# Patient Record
Sex: Female | Born: 1965 | Race: White | Hispanic: No | Marital: Married | Smoking: Never smoker
Health system: Southern US, Community
[De-identification: ages and names within clinical notes are randomized; demographics above are authoritative.]

## PROBLEM LIST (undated history)

## (undated) DIAGNOSIS — F32A Depression, unspecified: Secondary | ICD-10-CM

## (undated) DIAGNOSIS — E559 Vitamin D deficiency, unspecified: Secondary | ICD-10-CM

## (undated) DIAGNOSIS — F329 Major depressive disorder, single episode, unspecified: Secondary | ICD-10-CM

## (undated) DIAGNOSIS — C50919 Malignant neoplasm of unspecified site of unspecified female breast: Secondary | ICD-10-CM

## (undated) DIAGNOSIS — G43909 Migraine, unspecified, not intractable, without status migrainosus: Secondary | ICD-10-CM

## (undated) HISTORY — DX: Vitamin D deficiency, unspecified: E55.9

## (undated) HISTORY — DX: Malignant neoplasm of unspecified site of unspecified female breast: C50.919

## (undated) HISTORY — DX: Migraine, unspecified, not intractable, without status migrainosus: G43.909

## (undated) HISTORY — PX: LASIK: SHX215

## (undated) HISTORY — PX: BREAST SURGERY: SHX581

## (undated) HISTORY — PX: TUBAL LIGATION: SHX77

## (undated) HISTORY — DX: Depression, unspecified: F32.A

## (undated) HISTORY — DX: Major depressive disorder, single episode, unspecified: F32.9

---

## 2004-01-12 ENCOUNTER — Ambulatory Visit (HOSPITAL_COMMUNITY): Admission: RE | Admit: 2004-01-12 | Discharge: 2004-01-12 | Payer: Self-pay | Admitting: Gynecology

## 2006-06-02 ENCOUNTER — Ambulatory Visit: Payer: Self-pay | Admitting: Gynecology

## 2007-05-28 ENCOUNTER — Ambulatory Visit: Payer: Self-pay | Admitting: Obstetrics & Gynecology

## 2008-02-05 HISTORY — PX: OTHER SURGICAL HISTORY: SHX169

## 2010-02-15 ENCOUNTER — Ambulatory Visit: Payer: Self-pay | Admitting: Family Medicine

## 2010-02-21 ENCOUNTER — Ambulatory Visit: Payer: Self-pay | Admitting: Family Medicine

## 2010-03-06 ENCOUNTER — Ambulatory Visit: Payer: Self-pay | Admitting: Surgery

## 2010-03-13 ENCOUNTER — Ambulatory Visit: Payer: Self-pay | Admitting: Surgery

## 2010-03-15 ENCOUNTER — Ambulatory Visit: Payer: Self-pay | Admitting: Surgery

## 2010-03-15 HISTORY — PX: BREAST LUMPECTOMY: SHX2

## 2010-03-23 LAB — PATHOLOGY REPORT

## 2010-04-14 ENCOUNTER — Ambulatory Visit: Payer: Self-pay | Admitting: Family Medicine

## 2010-05-11 HISTORY — PX: PORT-A-CATH REMOVAL: SHX5289

## 2010-06-19 NOTE — Assessment & Plan Note (Signed)
NAME:  Brianna Gomez, Brianna Gomez NO.:  [id number]   MEDICAL RECORD NO.:  [id number]          PATIENT TYPE:  POB   LOCATION:  [facility] at [facility]         FACILITY:  [facility]   PHYSICIAN:  Ginger Carne, MD DATE OF BIRTH:  September 06, 1965   DATE OF SERVICE:  05/28/2007                                  CLINIC NOTE   ADDENDUM:  [name] complains of migraine headaches on the first day of  her menses which occur twice a month now.  These are not cluster in  nature and they have no oral component, but they are getting worse and  lasting several hours in the day.  I prescribed Fioricet 1-2 every 6-8  hours as needed #40 with one refill.  I told the patient to contact the  office if this is not beneficial for her.           ______________________________  Ginger Carne, MD     SHB/MEDQ  D:  05/28/2007  T:  05/28/2007  Job:  [id number]

## 2010-06-19 NOTE — Assessment & Plan Note (Signed)
NAME:  JALEXUS, BRETT                ACCOUNT NO.:  [id number]   MEDICAL RECORD NO.:  [id number]          PATIENT TYPE:  POB   LOCATION:  [facility] at [facility]         FACILITY:  [facility]   PHYSICIAN:  Ginger Carne, MD DATE OF BIRTH:  03/06/1965   DATE OF SERVICE:  05/28/2007                                  CLINIC NOTE   HISTORY OF PRESENT ILLNESS:  This is a 45 year old Caucasian,  multiparous female status post bilateral tubal ligation who presents  with a one year history of menses occurring every 2 weeks lasting 5-6  days with significant clotting.  The patient had a normal TSH level in  April 2008 and hemoglobin was 12.4 in April 2008.  She states she has  cramping associated with said bleeding.  She takes no medication to  enhance her bleeding propensity and has no personal family history of  bleeding diatheses.   PELVIC EXAMINATION:  Uterus is retroverted and flexed, normal size.  Both adnexa palpable, nontender.  Transvaginal ultrasound reveals the  uterus to be 5.7 cm an anterior posterior and sagittal dimension.  No  focal lesions were noted in the myometrium.  Endometrial biopsy was  performed.   We discussed various options including placement of a Mirena  intrauterine device, NovaSure endometrial ablation, total vaginal  hysterectomy with preservation of both tubes and ovaries and  contraceptive management as well.  The patient is leaning towards  NovaSure procedure.  She was provided information and literature and we  will contact her when said results are available.  She is scheduled to  have a yearly physical examination and this will be done in the near  future including a Pap smear.           ______________________________  Ginger Carne, MD     SHB/MEDQ  D:  05/28/2007  T:  05/28/2007  Job:  [id number]

## 2010-06-22 NOTE — H&P (Signed)
Brianna Gomez, Brianna Gomez                ACCOUNT NO.:  [id number]   MEDICAL RECORD NO.:  [id number]          PATIENT TYPE:  POB   LOCATION:  [facility]                          FACILITY:  [facility]   PHYSICIAN:  Ginger Carne, MD  DATE OF BIRTH:  Apr 25, 1965   DATE OF ADMISSION:  05/28/2007  DATE OF DISCHARGE:  05/28/2007                              HISTORY & PHYSICAL   REASON FOR HOSPITALIZATION:  Menometrorrhagia.   HISTORY OF PRESENT ILLNESS:  This patient is a 45 year old gravida 4,  para 3-0-1-3 Caucasian female who presents with a 1-year history of  worsening heavy menses lasting 10-14 days and occurring approximately  every 2 weeks.  It should be lasting 5-6 days and occurring every 2  weeks.  The patient had a normal TSH level in April 2008.  An  endometrial biopsy performed on May 28, 2007, revealed benign  secretory endometrium without neoplasia or hyperplasia.  Transvaginal  ultrasound revealed no evidence of myometrial lesions or obvious  intracavitary lesions on both adnexa were negative.  The patient takes  no medications to enhance her bleeding propensity and has no personal or  family history of bleeding diatheses.  The patient had been offered oral  contraceptives but had emotionally issues related to same.  She also  denied the option of a Marina intrauterine device.  Total vaginal  hysterectomy was also offered and the patient had settled down t  NovaSure endometrial ablation technique.   OB/GYN HISTORY:  The patient has had one first trimester miscarriage and  three full-term vaginal deliveries.  She has had a bilateral tubal  ligation performed in December 2005.   ALLERGIES:  None.   CURRENT MEDICATIONS:  None   MEDICAL HISTORY:  None.   SURGICAL HISTORY:  None.   SOCIAL HISTORY:  The patient is a nonsmoker.  Denies alcohol or illicit  drug abuse.   Family history is negative for first-degree relatives with breast,  ovarian, or uterine carcinoma.   REVIEW OF  SYSTEMS:  A 14-point comprehensive review of systems within  normal limits.   PHYSICAL EXAMINATION:  Blood pressure is 112/73, weight 127 pounds,  height 5 feet 1 inch, and pulse 73 and regular.  HEENT: Grossly normal.  Breast exam without masses, discharge, thickenings, or tenderness.  CHEST:  Clear to percussion and auscultation.  CARDIOVASCULAR:  Without murmurs or enlargements.  Regular rate and  rhythm.  EXTREMITIES, LYMPHATICS, SKIN, NEUROLOGICAL, MUSCULOSKELETAL SYSTEMS:  Within normal limits.  ABDOMEN:  Soft without gross hepatosplenomegaly.  PELVIC:  External Genitalia: Vulva and vagina normal.  Cervix smooth  without erosions or lesions.  Normal Pap smear on June 01, 2006,  preceded by series of normal Pap smears.  Uterus is normal sized.  Both  adnexa palpable and found to be normal.   IMPRESSION:  Menometrorrhagia.   PLAN:  After discussing aforementioned options including a Jearld Adjutant IUD,  oral contraceptive agents, and total vaginal hysterectomy, the patient  has agreed to a NovaSure endometrial ablation.  Ashby Dawes of said procedure  was discussed in detail.  Failure rate of approximately 50% for complete  amenorrhea  was understood by the patient.  She also understands that  approximately 20-25% of women who ultimately have this procedure may  eventually have a hysterectomy.  All questions were answered to the  satisfaction of the patient.  The patient verbalized understanding of  same.      Ginger Carne, MD  Electronically Signed     SHB/MEDQ  D:  07/20/2007  T:  07/21/2007  Job:  [id number]

## 2010-06-22 NOTE — Op Note (Signed)
NAMESLYVIA, LARTIGUE                ACCOUNT NO.:  [id number]   MEDICAL RECORD NO.:  [id number]          PATIENT TYPE:  AMB   LOCATION:  [facility]                           [facility]ACILITY[facility]  [facility]   PHYSICIAN:  Ginger Carne, MD  DATE OF BIRTH:  02-26-1965   DATE OF PROCEDURE:  DATE OF DISCHARGE:                                 OPERATIVE REPORT   PREOPERATIVE DIAGNOSES:  1.  Sterilization.  2.  Intrauterine device removal.   POSTOPERATIVE DIAGNOSES:  1.  Sterilization.  2.  Intrauterine device removal.   PROCEDURE:  1.  Bilateral laparoscopic tubal banding.  2.  Intrauterine device removal.   SURGEON:  Ginger Carne, M.D.   ASSISTANT:  None.   COMPLICATIONS:  None immediately.   ANESTHESIA:  General.   ESTIMATED BLOOD LOSS:  Negligible.   SPECIMENS:  None.   OPERATIVE FINDINGS:  External genitalia, vulva, and vagina normal.  Cervix  smooth without erosions or lesions.  The intrauterine device was easily  removed, identical to a Mirena intrauterine device.  Laparoscopic evaluation  demonstrated normal adnexa.  No evidence for pathology in the pelvis or  upper abdomen.  Uterine contour normal.  Uterine size normal.   OPERATIVE PROCEDURE:  The patient was prepped and draped in the usual  fashion and placed in the lithotomy position.  Betadine solution used for  antiseptic and the patient was catheterized prior to the procedure.  After  adequate general anesthesia, tenaculum placed on the anterior lip of the  cervix, intrauterine device removed easily, and a Hickman tenaculum placed  in endocervical canal.   A vertical infraumbilical incision was made and the Veress needle placed in  the abdomen.  Opening and closing pressures were 10 and 15 mmHg.  Gas-  release trocar placed in the same incision and laparoscope placed in the  trocar sleeve.  Afterwards, an 8 mm port was made under direct visualization  of the left lower quadrant.  Inspection of the pelvic contents and  abdominal  contents carried out.  The Falope ring applicator utilized to place said  rings on the isthmus and ampullary junction of both tubes.  These were well  applied without active bleeding.  Gas-release  trocars removed.  Closure of the 10 mm fascia with 0 Vicryl suture and  Dermabond for the skin.  Instrument and sponge count were correct.  The  patient tolerated the procedure well and returned to the postanesthesia  recovery room in excellent condition.     Stev   SHB/MEDQ  D:  01/12/2004  T:  01/12/2004  Job:  [id number]

## 2010-06-22 NOTE — H&P (Signed)
NAMEMIASHA, Brianna Gomez                ACCOUNT NO.:  [id number]   MEDICAL RECORD NO.:  [id number]          PATIENT TYPE:  AMB   LOCATION:  [facility]                           [facility]ACILITY[facility]   PHYSICIAN:  Ginger Carne, MD  DATE OF BIRTH:  1965/09/28   DATE OF ADMISSION:  01/12/2004  DATE OF DISCHARGE:                                HISTORY & PHYSICAL   ADMISSION DIAGNOSIS:  Request for permanent sterilization.   PROPOSED PROCEDURE:  Laparoscopic bilateral tubal banding and removal of  intrauterine device.   HISTORY OF PRESENT ILLNESS:  This patient is a 45 year old gravida 4, para 3-  0-1-3, Caucasian female admitted for a bilateral laparoscopic tubal banding.  The patient has no desire for further childbearing and understands that said  procedure is intended to be permanent.  At present she has a Mirena  intrauterine device that has been inserted in May 2005.  This was meant to  be temporary with the view that her husband would undergo a vasectomy, which  he has not followed through with.   OBSTETRIC/GYNECOLOGIC HISTORY:  The patient has had three full-term vaginal  deliveries in 1990, 1998, and also in 2002.  She had undergone a first  trimester miscarriage followed by a dilation and curettage in 1998.  The  patient's method of contraception, a Mirena intrauterine device.   ALLERGIES:  None.   CURRENT MEDICATIONS:  None.   SOCIAL HISTORY:  Negative for smoking, illicit drug abuse, or alcohol abuse.   FAMILY HISTORY:  Negative for breast, colon, ovarian, or uterine carcinoma.   MEDICAL AND SURGICAL HISTORY:  Negative.   REVIEW OF SYSTEMS:  Negative.   PHYSICAL EXAMINATION:  VITAL SIGNS:  Blood pressure 100/70, height 5 feet 4  inches, weight 143 pounds.  HEENT:  Grossly normal.  BREASTS:  Without masses, discharge, thickenings, or tenderness.  CHEST:  Clear to percussion and auscultation.  CARDIOVASCULAR:  Without murmurs or enlargements.  VASCULAR, EXTREMITY, LYMPHATIC,  SKIN, NEUROLOGIC, MUSCULOSKELETAL SYSTEMS:  Within normal limits.  ABDOMEN:  Soft without gross hepatosplenomegaly.  PELVIC:  External genitalia, vulva, and vagina normal.  Cervix smooth,  without erosions or lesions.  The uterus is small, anteverted, and flexed.  Both adnexa were palpable, found to be normal.  RECTAL:  Hemoccult-negative, without masses. Pap smear is normal.   IMPRESSION:  Request for permanent sterilization.   PLAN:  The patient was apprised as to the nature of said procedure,  including failure rates and alternative options.  She is scheduled for said  procedure, including the removal of an intrauterine device, on January 12, 2004.     Stev   SHB/MEDQ  D:  01/09/2004  T:  01/09/2004  Job:  [id number]

## 2010-08-01 ENCOUNTER — Emergency Department: Payer: Self-pay | Admitting: Emergency Medicine

## 2010-09-05 ENCOUNTER — Ambulatory Visit: Payer: Self-pay | Admitting: Radiation Oncology

## 2010-10-06 ENCOUNTER — Ambulatory Visit: Payer: Self-pay | Admitting: Radiation Oncology

## 2010-11-05 ENCOUNTER — Ambulatory Visit: Payer: Self-pay | Admitting: Radiation Oncology

## 2010-12-06 ENCOUNTER — Ambulatory Visit: Payer: Self-pay | Admitting: Radiation Oncology

## 2011-01-05 ENCOUNTER — Ambulatory Visit: Payer: Self-pay | Admitting: Radiation Oncology

## 2011-01-15 LAB — HEPATIC FUNCTION PANEL
ALT: 9 U/L (ref 7–35)
AST: 13 U/L (ref 13–35)
Alkaline Phosphatase: 58 U/L (ref 25–125)
Bilirubin, Direct: 0.1 mg/dL

## 2011-01-15 LAB — CBC AND DIFFERENTIAL
Hemoglobin: 11.9 g/dL — AB (ref 12.0–16.0)
Platelets: 185 10*3/uL (ref 150–399)
WBC: 217 10^3/mL

## 2011-01-15 LAB — BASIC METABOLIC PANEL: Creatinine: 1 mg/dL (ref <=1.1)

## 2011-01-15 LAB — LIPID PANEL: LDL Cholesterol: 136 mg/dL

## 2011-02-05 ENCOUNTER — Ambulatory Visit: Payer: Self-pay | Admitting: Radiation Oncology

## 2011-02-05 ENCOUNTER — Ambulatory Visit: Payer: Self-pay | Admitting: Internal Medicine

## 2011-02-12 ENCOUNTER — Ambulatory Visit: Payer: Self-pay

## 2011-03-07 LAB — CBC CANCER CENTER
Basophil #: 0 x10 3/mm (ref 0.0–0.1)
HCT: 35.5 % (ref 35.0–47.0)
Lymphocyte %: 34.7 %
MCH: 29 pg (ref 26.0–34.0)
Monocyte %: 8.1 %
Neutrophil %: 54.3 %
Platelet: 174 x10 3/mm (ref 150–440)
RDW: 12.3 % (ref 11.5–14.5)
WBC: 3 x10 3/mm — ABNORMAL LOW (ref 3.6–11.0)

## 2011-03-07 LAB — COMPREHENSIVE METABOLIC PANEL
Alkaline Phosphatase: 71 U/L (ref 50–136)
BUN: 15 mg/dL (ref 7–18)
Bilirubin,Total: 0.4 mg/dL (ref 0.2–1.0)
Calcium, Total: 8.7 mg/dL (ref 8.5–10.1)
Chloride: 106 mmol/L (ref 98–107)
EGFR (African American): >60
EGFR (Non-African Amer.): 58 — ABNORMAL LOW
Glucose: 120 mg/dL — ABNORMAL HIGH (ref 65–99)
Potassium: 4.4 mmol/L (ref 3.5–5.1)
SGOT(AST): 22 U/L (ref 15–37)
SGPT (ALT): 35 U/L
Sodium: 141 mmol/L (ref 136–145)
Total Protein: 6.7 g/dL (ref 6.4–8.2)

## 2011-03-08 ENCOUNTER — Ambulatory Visit: Payer: Self-pay | Admitting: Radiation Oncology

## 2011-04-05 ENCOUNTER — Ambulatory Visit: Payer: Self-pay | Admitting: Radiation Oncology

## 2011-05-06 ENCOUNTER — Ambulatory Visit: Payer: Self-pay | Admitting: Radiation Oncology

## 2011-05-09 LAB — COMPREHENSIVE METABOLIC PANEL
Albumin: 3.6 g/dL (ref 3.4–5.0)
Anion Gap: 7 (ref 7–16)
BUN: 13 mg/dL (ref 7–18)
Bilirubin,Total: 0.3 mg/dL (ref 0.2–1.0)
Chloride: 107 mmol/L (ref 98–107)
EGFR (African American): >60
Glucose: 101 mg/dL — ABNORMAL HIGH (ref 65–99)
Potassium: 4.1 mmol/L (ref 3.5–5.1)
SGPT (ALT): 31 U/L
Total Protein: 6.3 g/dL — ABNORMAL LOW (ref 6.4–8.2)

## 2011-05-09 LAB — CBC CANCER CENTER
Basophil #: 0 x10 3/mm (ref 0.0–0.1)
Eosinophil %: 2.6 %
HCT: 34.6 % — ABNORMAL LOW (ref 35.0–47.0)
HGB: 11.6 g/dL — ABNORMAL LOW (ref 12.0–16.0)
Lymphocyte #: 1.1 x10 3/mm (ref 1.0–3.6)
Lymphocyte %: 34.4 %
MCHC: 33.7 g/dL (ref 32.0–36.0)
Monocyte %: 7.5 %
Neutrophil #: 1.8 x10 3/mm (ref 1.4–6.5)
Neutrophil %: 55.1 %
Platelet: 171 x10 3/mm (ref 150–440)
RBC: 3.98 10*6/uL (ref 3.80–5.20)
WBC: 3.2 x10 3/mm — ABNORMAL LOW (ref 3.6–11.0)

## 2011-06-05 ENCOUNTER — Ambulatory Visit: Payer: Self-pay | Admitting: Radiation Oncology

## 2011-06-05 ENCOUNTER — Ambulatory Visit: Payer: Self-pay | Admitting: Internal Medicine

## 2011-07-06 ENCOUNTER — Ambulatory Visit: Payer: Self-pay | Admitting: Radiation Oncology

## 2011-08-01 LAB — CBC CANCER CENTER
Basophil %: 0.5 %
Eosinophil %: 0.9 %
HCT: 37.5 % (ref 35.0–47.0)
HGB: 12.7 g/dL (ref 12.0–16.0)
MCH: 29.5 pg (ref 26.0–34.0)
MCHC: 34 g/dL (ref 32.0–36.0)
MCV: 87 fL (ref 80–100)
Monocyte %: 7.8 %
Neutrophil %: 69.2 %
RDW: 12.6 % (ref 11.5–14.5)
WBC: 5.2 x10 3/mm (ref 3.6–11.0)

## 2011-08-01 LAB — CREATININE, SERUM: EGFR (Non-African Amer.): >60

## 2011-08-01 LAB — HEPATIC FUNCTION PANEL A (ARMC)
Albumin: 3.7 g/dL (ref 3.4–5.0)
Bilirubin, Direct: 0 mg/dL (ref 0.00–0.20)
SGPT (ALT): 28 U/L
Total Protein: 6.7 g/dL (ref 6.4–8.2)

## 2011-08-02 LAB — CANCER ANTIGEN 27.29: CA 27.29: 9.2 U/mL (ref 0.0–38.6)

## 2011-08-05 ENCOUNTER — Ambulatory Visit: Payer: Self-pay | Admitting: Radiation Oncology

## 2012-01-09 ENCOUNTER — Ambulatory Visit: Payer: Self-pay | Admitting: Internal Medicine

## 2012-02-24 ENCOUNTER — Encounter: Payer: Self-pay | Admitting: Internal Medicine

## 2012-02-24 ENCOUNTER — Other Ambulatory Visit (HOSPITAL_COMMUNITY)
Admission: RE | Admit: 2012-02-24 | Discharge: 2012-02-24 | Disposition: A | Discharge status: Home / Self Care | Payer: BC Managed Care – PPO | Source: Ambulatory Visit | Attending: Internal Medicine | Admitting: Internal Medicine

## 2012-02-24 ENCOUNTER — Encounter: Payer: Self-pay | Admitting: *Deleted

## 2012-02-24 ENCOUNTER — Ambulatory Visit (INDEPENDENT_AMBULATORY_CARE_PROVIDER_SITE_OTHER): Payer: BC Managed Care – PPO | Admitting: Internal Medicine

## 2012-02-24 VITALS — BP 114/77 | HR 84 | Temp 98.0°F | Ht 61.0 in | Wt 138.0 lb

## 2012-02-24 DIAGNOSIS — F32A Depression, unspecified: Secondary | ICD-10-CM

## 2012-02-24 DIAGNOSIS — F329 Major depressive disorder, single episode, unspecified: Secondary | ICD-10-CM

## 2012-02-24 DIAGNOSIS — R5381 Other malaise: Secondary | ICD-10-CM

## 2012-02-24 DIAGNOSIS — E559 Vitamin D deficiency, unspecified: Secondary | ICD-10-CM

## 2012-02-24 DIAGNOSIS — C50919 Malignant neoplasm of unspecified site of unspecified female breast: Secondary | ICD-10-CM

## 2012-02-24 DIAGNOSIS — Z01419 Encounter for gynecological examination (general) (routine) without abnormal findings: Secondary | ICD-10-CM | POA: Insufficient documentation

## 2012-02-24 DIAGNOSIS — Z9109 Other allergy status, other than to drugs and biological substances: Secondary | ICD-10-CM

## 2012-02-24 DIAGNOSIS — Z1151 Encounter for screening for human papillomavirus (HPV): Secondary | ICD-10-CM | POA: Insufficient documentation

## 2012-02-24 DIAGNOSIS — Z853 Personal history of malignant neoplasm of breast: Secondary | ICD-10-CM | POA: Insufficient documentation

## 2012-02-24 DIAGNOSIS — G43909 Migraine, unspecified, not intractable, without status migrainosus: Secondary | ICD-10-CM

## 2012-02-24 DIAGNOSIS — Z139 Encounter for screening, unspecified: Secondary | ICD-10-CM

## 2012-02-24 DIAGNOSIS — R5383 Other fatigue: Secondary | ICD-10-CM

## 2012-02-25 ENCOUNTER — Encounter: Payer: Self-pay | Admitting: Internal Medicine

## 2012-02-25 DIAGNOSIS — G43909 Migraine, unspecified, not intractable, without status migrainosus: Secondary | ICD-10-CM | POA: Insufficient documentation

## 2012-02-25 DIAGNOSIS — E559 Vitamin D deficiency, unspecified: Secondary | ICD-10-CM | POA: Insufficient documentation

## 2012-02-25 DIAGNOSIS — F329 Major depressive disorder, single episode, unspecified: Secondary | ICD-10-CM | POA: Insufficient documentation

## 2012-02-25 DIAGNOSIS — F32A Depression, unspecified: Secondary | ICD-10-CM | POA: Insufficient documentation

## 2012-02-25 DIAGNOSIS — Z9109 Other allergy status, other than to drugs and biological substances: Secondary | ICD-10-CM | POA: Insufficient documentation

## 2012-02-25 NOTE — Assessment & Plan Note (Signed)
S/p mastectomy (segmental), chemotherapy, XRT and herceptin.  Sees Dr Garlon Hatchet at [facility].  Doing well.  Just had follow up mammogram and states everything checked out fine.   Follow.

## 2012-02-25 NOTE — Assessment & Plan Note (Signed)
Controlled.  

## 2012-02-25 NOTE — Assessment & Plan Note (Signed)
Recheck vitamin  D level today.  

## 2012-02-25 NOTE — Assessment & Plan Note (Signed)
Was related to her menstrual cycle.  Since her ablation, the headaches have not been a significant issue.  Follow.     

## 2012-02-25 NOTE — Progress Notes (Signed)
  Subjective:    Patient ID: Brianna Gomez, female    DOB: [birth year], 47 y.o.   MRN: [id number]  HPI 47 year old female with past history of breast cancer s/p chemotherapy, XRT and herceptin.  She comes in today to establish care and for a complete physical exam.  She states she is doing well.  No chest pain or tightness.  Breathing stable.  No acid reflux.  Bowels doing well.  No period.  No history of abnormal pap.  Previously saw Dr Feliberto Gottron.  No bowel change.   Past Medical History  Diagnosis Date  . Migraines   . Depression     related to cancer diagnosis  . Vitamin D deficiency   . Breast cancer     s/p lumpectomy, segmental mastectomy, chemotherapy and XRT. s/p herceptin.     Review of Systems Patient denies any headache, lightheadedness or dizziness.  Minimal allergy symptoms.  No chest pain, tightness or palpitations.  No increased shortness of breath, cough or congestion.  No nausea or vomiting.  No acid reflux.  No abdominal pain or cramping.  No bowel change, such as diarrhea, constipation, BRBPR or melana.  No urine change.  Off antidepressant and doing well.      Objective:   Physical Exam Filed Vitals:   02/24/12 1334  BP: 114/77  Pulse: 84  Temp: 98 F (34.53 C)   47 year old female in no acute distress.   HEENT:  Nares- clear.  Oropharynx - without lesions. NECK:  Supple.  Nontender.  No audible bruit.  HEART:  Appears to be regular. LUNGS:  No crackles or wheezing audible.  Respirations even and unlabored.  RADIAL PULSE:  Equal bilaterally.    BREASTS:  No nipple discharge or nipple retraction present.  Could not appreciate any distinct nodules or axillary adenopathy.  ABDOMEN:  Soft, nontender.  Bowel sounds present and normal.  No audible abdominal bruit.  GU:  Normal external genitalia.  Vaginal vault without lesions.  Cervix identified.  Pap performed. Could not appreciate any adnexal masses or tenderness.   EXTREMITIES:  No increased edema present.  DP  pulses palpable and equal bilaterally.           Assessment & Plan:  FATIGUE.  Some fatigue.  Overall doing well.  Check cbc, met c and tsh.   HEALTH MAINTENANCE.  Physical today.  Pap today.  Up to date with mammogram.

## 2012-02-25 NOTE — Assessment & Plan Note (Signed)
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  Off lexapro now.  Follow.  Doing well.

## 2012-03-04 ENCOUNTER — Other Ambulatory Visit: Payer: BC Managed Care – PPO

## 2012-03-11 ENCOUNTER — Encounter: Payer: Self-pay | Admitting: Internal Medicine

## 2012-03-20 ENCOUNTER — Other Ambulatory Visit: Payer: BC Managed Care – PPO

## 2012-03-21 ENCOUNTER — Other Ambulatory Visit: Payer: Self-pay

## 2012-03-23 ENCOUNTER — Other Ambulatory Visit (INDEPENDENT_AMBULATORY_CARE_PROVIDER_SITE_OTHER): Payer: BC Managed Care – PPO

## 2012-03-23 DIAGNOSIS — R5381 Other malaise: Secondary | ICD-10-CM

## 2012-03-23 DIAGNOSIS — Z139 Encounter for screening, unspecified: Secondary | ICD-10-CM

## 2012-03-23 DIAGNOSIS — R5383 Other fatigue: Secondary | ICD-10-CM

## 2012-03-23 DIAGNOSIS — C50919 Malignant neoplasm of unspecified site of unspecified female breast: Secondary | ICD-10-CM

## 2012-03-24 LAB — COMPREHENSIVE METABOLIC PANEL
ALT: 14 U/L (ref 0–35)
AST: 18 U/L (ref 0–37)
Alkaline Phosphatase: 65 U/L (ref 39–117)
Sodium: 141 mEq/L (ref 135–145)
Total Bilirubin: 0.4 mg/dL (ref 0.3–1.2)
Total Protein: 6.6 g/dL (ref 6.0–8.3)

## 2012-03-24 LAB — CBC WITH DIFFERENTIAL/PLATELET
Basophils Absolute: 0 10*3/uL (ref 0.0–0.1)
Eosinophils Absolute: 0 10*3/uL (ref 0.0–0.7)
HCT: 36.6 % (ref 36.0–46.0)
Lymphs Abs: 1.5 10*3/uL (ref 0.7–4.0)
MCHC: 33.7 g/dL (ref 30.0–36.0)
MCV: 86.3 fl (ref 78.0–100.0)
Monocytes Absolute: 0.3 10*3/uL (ref 0.1–1.0)
Monocytes Relative: 7 % (ref 3.0–12.0)
Platelets: 192 10*3/uL (ref 150.0–400.0)
RDW: 13.6 % (ref 11.5–14.6)

## 2012-03-24 LAB — TSH: TSH: 3.51 u[IU]/mL (ref 0.35–5.50)

## 2012-03-24 LAB — LIPID PANEL
Total CHOL/HDL Ratio: 5
VLDL: 30 mg/dL (ref 0.0–40.0)

## 2012-03-24 LAB — LDL CHOLESTEROL, DIRECT: Direct LDL: 144 mg/dL

## 2012-03-26 ENCOUNTER — Ambulatory Visit: Payer: Self-pay | Admitting: Internal Medicine

## 2012-03-26 ENCOUNTER — Telehealth: Payer: Self-pay | Admitting: Internal Medicine

## 2012-03-26 DIAGNOSIS — D72819 Decreased white blood cell count, unspecified: Secondary | ICD-10-CM

## 2012-03-26 NOTE — Telephone Encounter (Signed)
Appointment 3/20 pt aware °

## 2012-03-26 NOTE — Telephone Encounter (Signed)
Pt notified of labs via my chart.  She needs a non fasting lab appt scheduled in the next 3-4 weeks.  Please call pt with an appt time.  Thanks.

## 2012-04-23 ENCOUNTER — Other Ambulatory Visit: Payer: BC Managed Care – PPO

## 2012-04-24 ENCOUNTER — Other Ambulatory Visit (INDEPENDENT_AMBULATORY_CARE_PROVIDER_SITE_OTHER): Payer: BC Managed Care – PPO

## 2012-04-24 DIAGNOSIS — D72819 Decreased white blood cell count, unspecified: Secondary | ICD-10-CM

## 2012-04-24 LAB — CBC WITH DIFFERENTIAL/PLATELET
Basophils Absolute: 0 10*3/uL (ref 0.0–0.1)
Eosinophils Absolute: 0.1 10*3/uL (ref 0.0–0.7)
HCT: 38.4 % (ref 36.0–46.0)
Lymphocytes Relative: 40 % (ref 12–46)
MCH: 28.5 pg (ref 26.0–34.0)
Neutro Abs: 2.5 10*3/uL (ref 1.7–7.7)
Platelets: 201 10*3/uL (ref 150–400)
RDW: 13.5 % (ref 11.5–15.5)
WBC: 4.9 10*3/uL (ref 4.0–10.5)

## 2012-04-29 ENCOUNTER — Telehealth: Payer: Self-pay | Admitting: Internal Medicine

## 2012-04-29 NOTE — Telephone Encounter (Signed)
Notified of normal cbc via my chart

## 2012-05-13 ENCOUNTER — Ambulatory Visit: Payer: Self-pay | Admitting: Internal Medicine

## 2012-06-04 ENCOUNTER — Ambulatory Visit: Payer: Self-pay | Admitting: Internal Medicine

## 2012-11-24 ENCOUNTER — Encounter: Payer: Self-pay | Admitting: Internal Medicine

## 2012-12-10 ENCOUNTER — Other Ambulatory Visit: Payer: Self-pay

## 2013-01-11 IMAGING — US ULTRASOUND LEFT BREAST
1 series · 17 of 25 positions shown · non-contrast
Comparison: none

REASON FOR EXAM: LT PARENCHYMAL DENSITY W CALCIFICATIONS
COMMENTS:

[Series 1: ultrasound left breast · 17 of 42 slices shown]
[im 1/42]
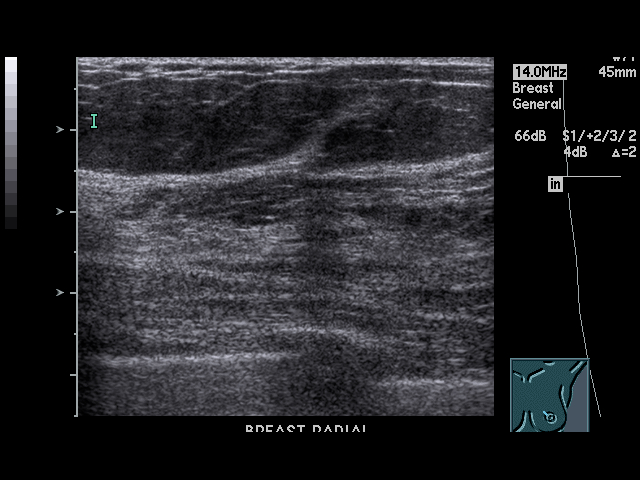
[im 4/42]
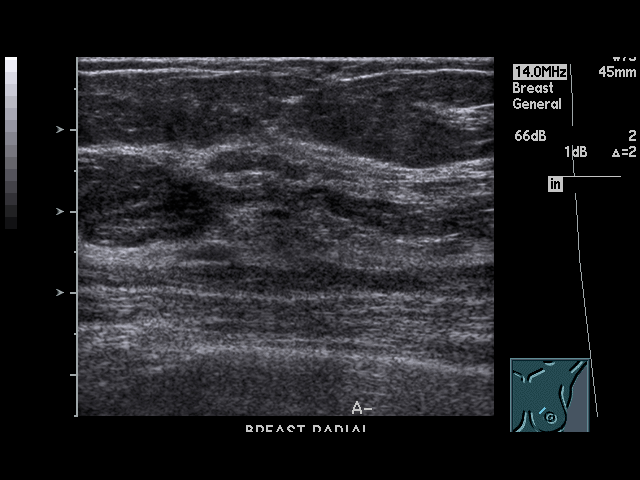
[im 6/42]
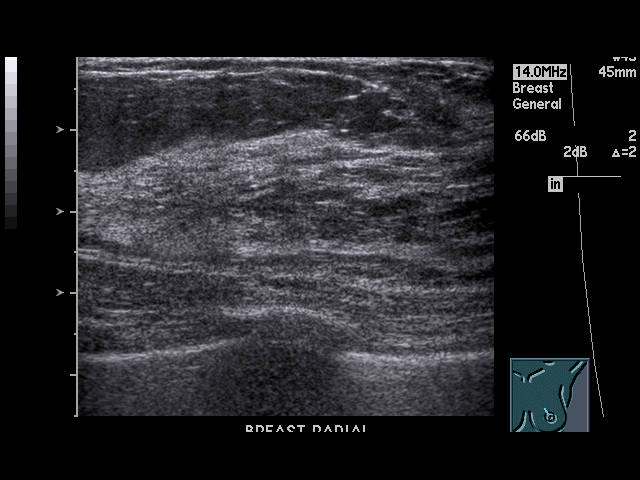
[im 9/42]
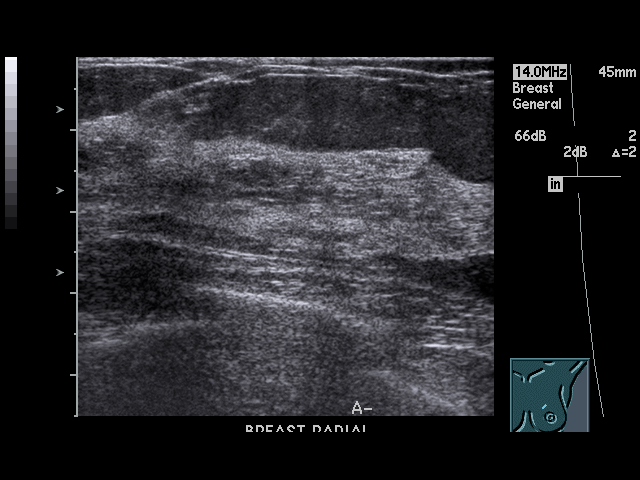
[im 11/42]
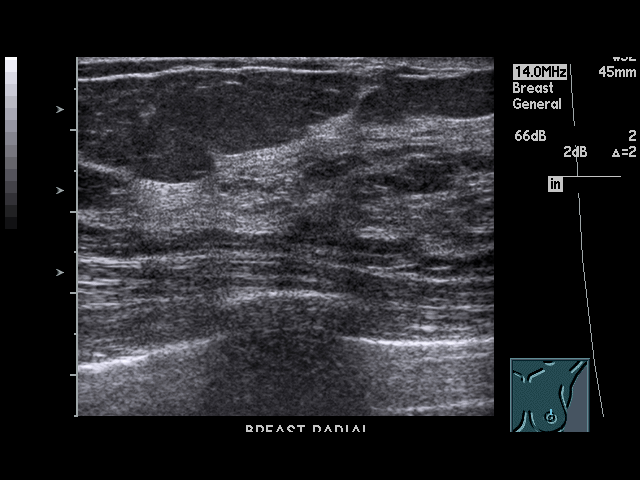
[im 14/42]
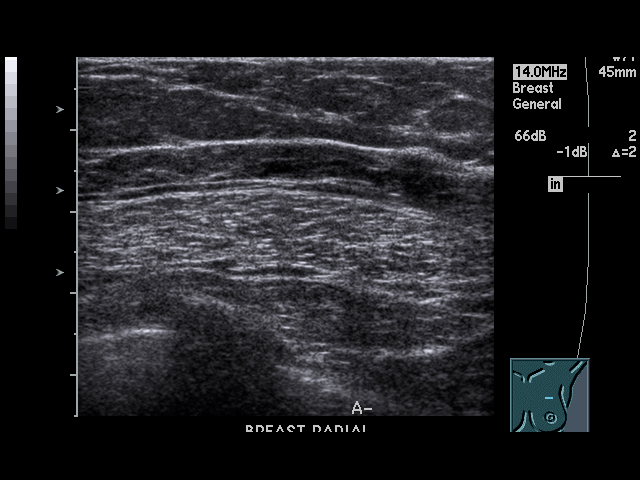
[im 16/42]
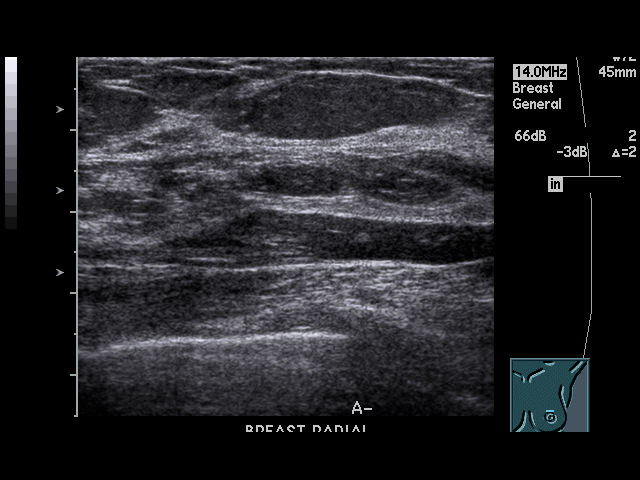
[im 19/42]
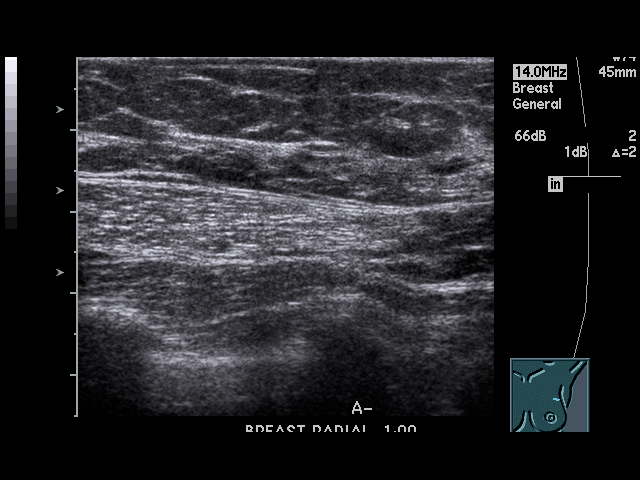
[im 21/42]
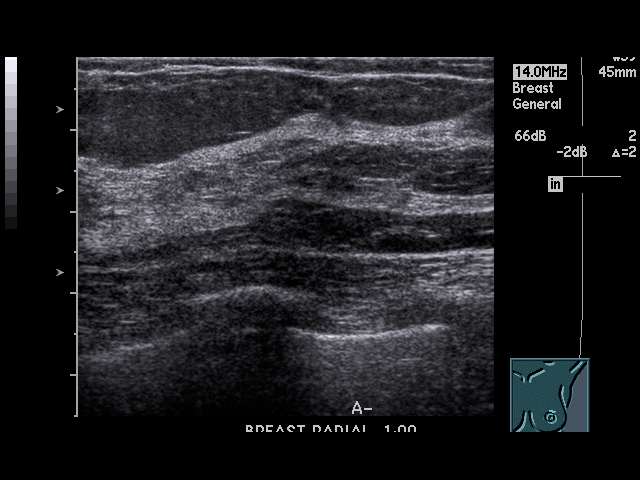
[im 23/42]
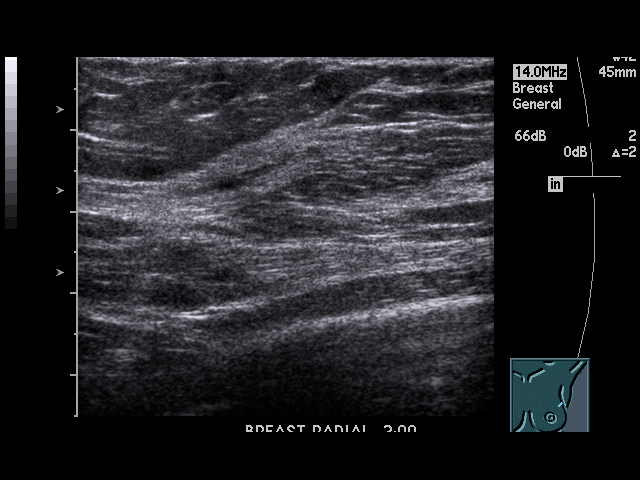
[im 26/42]
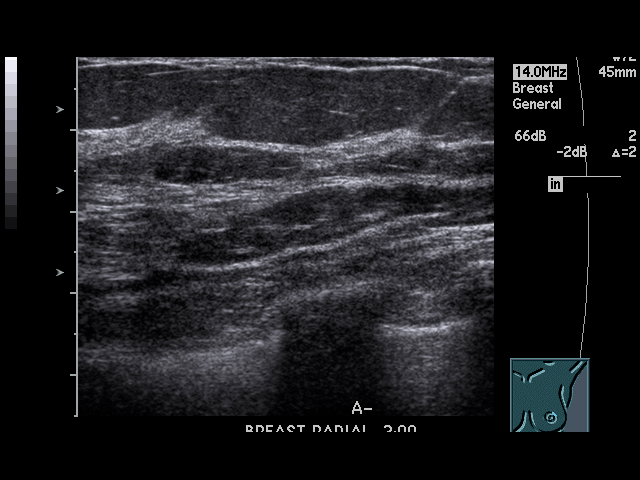
[im 28/42]
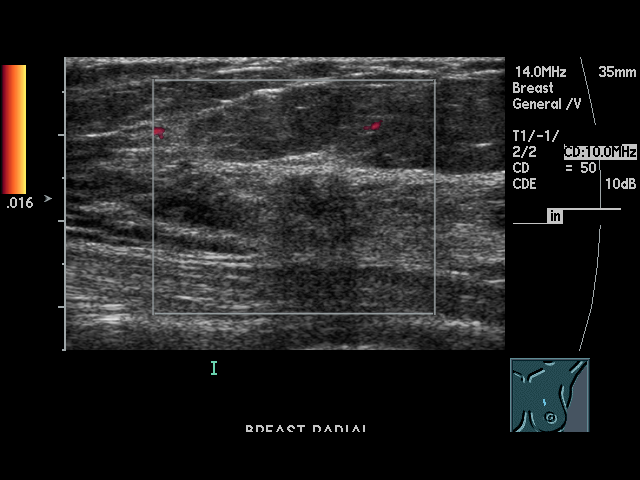
[im 31/42]
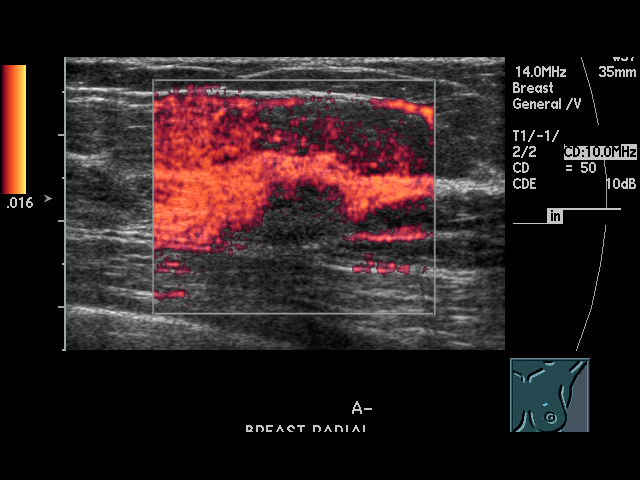
[im 33/42]
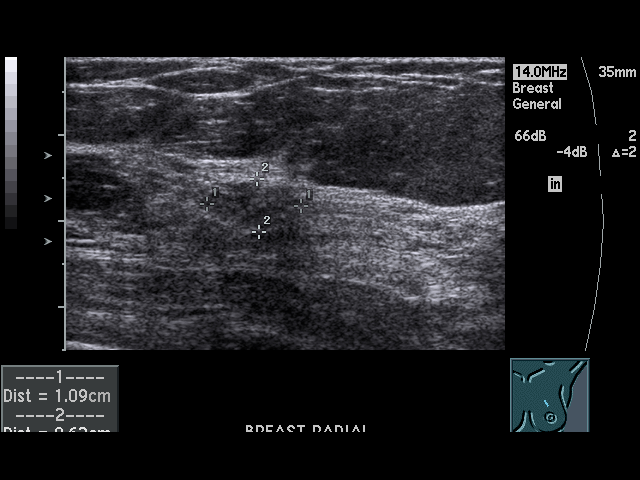
[im 36/42]
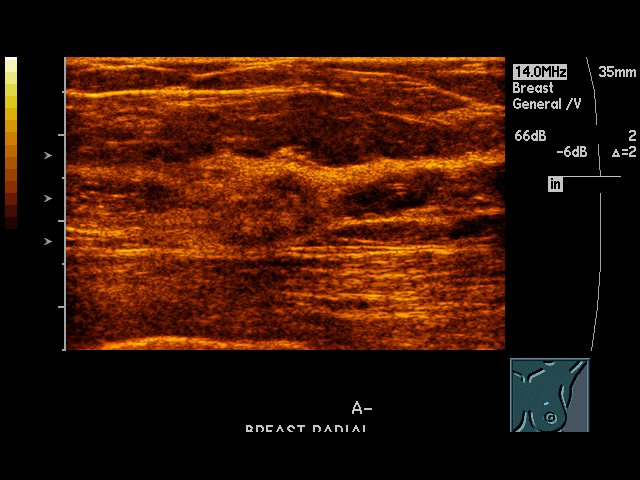
[im 38/42]
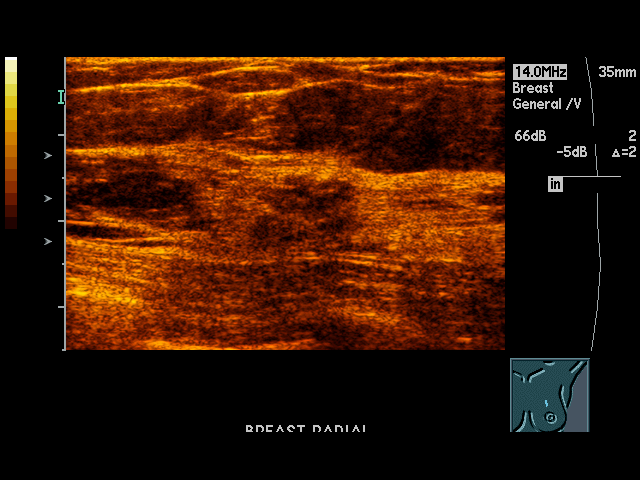
[im 42/42]
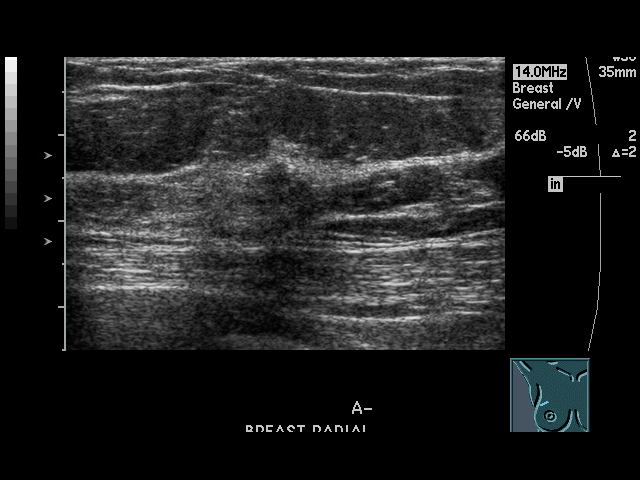

[17 of 25 positions shown; findings below may reference images not displayed]

PROCEDURE:     US  - US BREAST LEFT  - February 21, 2010 [DATE]

RESULT:     Ultrasound of the left breast demonstrates a somewhat poorly
marginated 10.9 x 6.2 x 7.9 mm solid hypoechoic mass with some shadowing.
When combined with the mammographic appearance the appearance is concerning
for malignancy and surgical consultation with consideration for excision is
suggested.
IMPRESSION: BI-RADS: Category 4 - Suspicious Abnormality.

Surgical consideration for excision of the breast in the upper central
posterior left breast with calcifications is suggested.

## 2013-02-25 ENCOUNTER — Encounter: Payer: Self-pay | Admitting: Internal Medicine

## 2013-02-25 ENCOUNTER — Other Ambulatory Visit: Payer: Self-pay | Admitting: Internal Medicine

## 2013-02-25 ENCOUNTER — Ambulatory Visit (INDEPENDENT_AMBULATORY_CARE_PROVIDER_SITE_OTHER): Payer: BC Managed Care – PPO | Admitting: Internal Medicine

## 2013-02-25 VITALS — BP 110/80 | HR 72 | Temp 98.5°F | Ht 60.8 in | Wt 137.2 lb

## 2013-02-25 DIAGNOSIS — E7800 Pure hypercholesterolemia, unspecified: Secondary | ICD-10-CM

## 2013-02-25 DIAGNOSIS — E559 Vitamin D deficiency, unspecified: Secondary | ICD-10-CM

## 2013-02-25 DIAGNOSIS — F3289 Other specified depressive episodes: Secondary | ICD-10-CM

## 2013-02-25 DIAGNOSIS — G43909 Migraine, unspecified, not intractable, without status migrainosus: Secondary | ICD-10-CM

## 2013-02-25 DIAGNOSIS — C50919 Malignant neoplasm of unspecified site of unspecified female breast: Secondary | ICD-10-CM

## 2013-02-25 DIAGNOSIS — Z9109 Other allergy status, other than to drugs and biological substances: Secondary | ICD-10-CM

## 2013-02-25 DIAGNOSIS — F329 Major depressive disorder, single episode, unspecified: Secondary | ICD-10-CM

## 2013-02-25 DIAGNOSIS — F32A Depression, unspecified: Secondary | ICD-10-CM

## 2013-02-25 LAB — CBC WITH DIFFERENTIAL/PLATELET
BASOS ABS: 0 10*3/uL (ref 0.0–0.1)
Basophils Relative: 0.4 % (ref 0.0–3.0)
Eosinophils Absolute: 0.1 10*3/uL (ref 0.0–0.7)
Eosinophils Relative: 2.2 % (ref 0.0–5.0)
HEMATOCRIT: 40.3 % (ref 36.0–46.0)
HEMOGLOBIN: 13.9 g/dL (ref 12.0–15.0)
LYMPHS ABS: 1.5 10*3/uL (ref 0.7–4.0)
Lymphocytes Relative: 37.7 % (ref 12.0–46.0)
MCHC: 34.5 g/dL (ref 30.0–36.0)
MCV: 84.7 fl (ref 78.0–100.0)
Monocytes Absolute: 0.3 10*3/uL (ref 0.1–1.0)
Monocytes Relative: 7.6 % (ref 3.0–12.0)
NEUTROS ABS: 2.1 10*3/uL (ref 1.4–7.7)
Neutrophils Relative %: 52.1 % (ref 43.0–77.0)
Platelets: 206 10*3/uL (ref 150.0–400.0)
RBC: 4.76 Mil/uL (ref 3.87–5.11)
RDW: 12.6 % (ref 11.5–14.6)
WBC: 3.9 10*3/uL — ABNORMAL LOW (ref 4.5–10.5)

## 2013-02-25 LAB — COMPREHENSIVE METABOLIC PANEL
ALT: 16 U/L (ref 0–35)
AST: 18 U/L (ref 0–37)
Albumin: 4.2 g/dL (ref 3.5–5.2)
Alkaline Phosphatase: 59 U/L (ref 39–117)
BUN: 17 mg/dL (ref 6–23)
CO2: 27 mEq/L (ref 19–32)
CREATININE: 1 mg/dL (ref 0.4–1.2)
Calcium: 9.5 mg/dL (ref 8.4–10.5)
Chloride: 108 mEq/L (ref 96–112)
GFR: 66.89 mL/min (ref >60.00)
Glucose, Bld: 79 mg/dL (ref 70–99)
Potassium: 4.1 mEq/L (ref 3.5–5.1)
Sodium: 140 mEq/L (ref 135–145)
Total Bilirubin: 0.4 mg/dL (ref 0.3–1.2)
Total Protein: 7.2 g/dL (ref 6.0–8.3)

## 2013-02-25 LAB — LDL CHOLESTEROL, DIRECT: Direct LDL: 160.6 mg/dL

## 2013-02-25 LAB — LIPID PANEL
CHOLESTEROL: 225 mg/dL — AB (ref 0–200)
HDL: 52.4 mg/dL (ref >39.00)
TRIGLYCERIDES: 99 mg/dL (ref 0.0–149.0)
Total CHOL/HDL Ratio: 4
VLDL: 19.8 mg/dL (ref 0.0–40.0)

## 2013-02-25 LAB — TSH: TSH: 2.37 u[IU]/mL (ref 0.35–5.50)

## 2013-02-25 MED ORDER — SERTRALINE HCL 50 MG PO TABS
50.0000 mg | ORAL_TABLET | Freq: Every day | ORAL | Status: DC
Start: 2013-02-25 — End: 2013-06-13

## 2013-02-25 NOTE — Progress Notes (Signed)
Pre-visit discussion using our clinic review tool. No additional management support is needed unless otherwise documented below in the visit note.  

## 2013-02-25 NOTE — Progress Notes (Signed)
  Subjective:    Patient ID: Brianna Gomez, female    DOB: Dec 22, 1965, 48 y.o.   MRN: [id number]  HPI 48 year old female with past history of breast cancer s/p chemotherapy, XRT and herceptin.  She comes in today for a complete physical exam.  She states she is doing well.  No chest pain or tightness.  Breathing stable.  No acid reflux.  Bowels doing well.  No period.  No history of abnormal pap.  Previously saw Dr Ouida Sills.  No bowel change.  Sees Dr Alinda Money at [facility] for her breast cancer.  Has her mammogram scheduled for next week.  Sees him every three to six months.  Sees Dr Donella Stade once a year.  Describes a sharp pain - left breast.  When flares, may last to 15-30 seconds. May occur one to two times per month.  Has been present for six months.  No pain with deep breathing.  No acid reflux.  Eating and drinking well.     Past Medical History  Diagnosis Date  . Migraines   . Depression     related to cancer diagnosis  . Vitamin D deficiency   . Breast cancer     s/p lumpectomy, segmental mastectomy, chemotherapy and XRT. s/p herceptin.     Outpatient Encounter Prescriptions as of 02/25/2013  Medication Sig  . cholecalciferol (VITAMIN D) 1000 UNITS tablet Take 1,000 Units by mouth daily.  . TURMERIC PO Take by mouth.     Review of Systems Patient denies any headache, lightheadedness or dizziness.  No significant sinus or allergy symptoms.   No chest pain, tightness or palpitations.  No increased shortness of breath, cough or congestion.  No nausea or vomiting.  No acid reflux.  No abdominal pain or cramping.  No bowel change, such as diarrhea, constipation, BRBPR or melana.  No urine change.  Sharp pain in the left breast.  Has been present over the last 6 months as outlined.  Increased stress related to her medical testing.  Discussed restarting something for the anxiety and stress.  Discussed zoloft.       Objective:   Physical Exam  Filed Vitals:   02/25/13 0823  BP: 110/80   Pulse: 72  Temp: 98.5 F (41.11 C)   48 year old female in no acute distress.   HEENT:  Nares- clear.  Oropharynx - without lesions. NECK:  Supple.  Nontender.  No audible bruit.  HEART:  Appears to be regular. LUNGS:  No crackles or wheezing audible.  Respirations even and unlabored.  RADIAL PULSE:  Equal bilaterally.    BREASTS:  No nipple discharge or nipple retraction present.  Could not appreciate any distinct nodules or axillary adenopathy.  ABDOMEN:  Soft, nontender.  Bowel sounds present and normal.  No audible abdominal bruit.  GU:  Not performed.    EXTREMITIES:  No increased edema present.  DP pulses palpable and equal bilaterally.           Assessment & Plan:  FATIGUE.  Some fatigue.  Overall doing well.  Check cbc, met c and tsh.   HEALTH MAINTENANCE.  Physical today.  Pap 02/24/12 negative with negative HPV.  Up to date with mammogram.  Due next week for f/u mammogram.

## 2013-02-26 ENCOUNTER — Telehealth: Payer: Self-pay | Admitting: Internal Medicine

## 2013-02-26 ENCOUNTER — Encounter: Payer: Self-pay | Admitting: Internal Medicine

## 2013-02-26 DIAGNOSIS — D72819 Decreased white blood cell count, unspecified: Secondary | ICD-10-CM

## 2013-02-26 LAB — VITAMIN D 25 HYDROXY (VIT D DEFICIENCY, FRACTURES): Vit D, 25-Hydroxy: 40 ng/mL (ref 30–89)

## 2013-02-26 NOTE — Telephone Encounter (Signed)
Pt notified of lab results via my chart.  Needs a non fasting lab appt schedule in 2-3 weeks and a fasting lab in 6 months.  Please schedule her lab appts and contact her with appt dates and time.  Thanks.

## 2013-02-28 ENCOUNTER — Encounter: Payer: Self-pay | Admitting: Internal Medicine

## 2013-02-28 NOTE — Assessment & Plan Note (Signed)
S/p mastectomy (segmental), chemotherapy, XRT and herceptin.  Sees Dr Alinda Money at [facility].  Doing well.  Due follow up mammogram next week.  Follow.

## 2013-02-28 NOTE — Assessment & Plan Note (Signed)
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  Has been doing relatively well.  Discussed with her today.  Some increased anxiety and stress related to testing.  Gave her a prescription for zoloft.  Will start if needed.   Follow.

## 2013-02-28 NOTE — Assessment & Plan Note (Signed)
Was related to her menstrual cycle.  Since her ablation, the headaches have not been a significant issue.  Follow.     

## 2013-02-28 NOTE — Assessment & Plan Note (Signed)
Low cholesterol diet and exercise.  Follow.  Recheck lipid panel today.   

## 2013-02-28 NOTE — Assessment & Plan Note (Signed)
Recheck vitamin  D level today.  

## 2013-02-28 NOTE — Assessment & Plan Note (Signed)
Controlled.  

## 2013-03-02 ENCOUNTER — Telehealth: Payer: Self-pay | Admitting: Internal Medicine

## 2013-03-02 NOTE — Telephone Encounter (Signed)
Non fasting labs 2/10  Fasting labs 7/20  Sent my my chart message to let pt know about appointment

## 2013-03-02 NOTE — Telephone Encounter (Signed)
Pt needs a f/u appt with me in 6-8 weeks (39min or end of 1/2 day).  Please schedule and contact her with a lab appt date and time.    Dr Nicki Reaper

## 2013-03-03 NOTE — Telephone Encounter (Signed)
Appointment 3/12 sent pt my chart message letting her know about appointment

## 2013-03-16 ENCOUNTER — Other Ambulatory Visit: Payer: BC Managed Care – PPO

## 2013-04-08 ENCOUNTER — Encounter: Payer: Self-pay | Admitting: Internal Medicine

## 2013-04-08 ENCOUNTER — Ambulatory Visit (INDEPENDENT_AMBULATORY_CARE_PROVIDER_SITE_OTHER): Payer: BC Managed Care – PPO | Admitting: Internal Medicine

## 2013-04-08 VITALS — BP 108/68 | HR 80 | Temp 98.5°F | Ht 60.8 in | Wt 140.2 lb

## 2013-04-08 DIAGNOSIS — F329 Major depressive disorder, single episode, unspecified: Secondary | ICD-10-CM

## 2013-04-08 DIAGNOSIS — C50919 Malignant neoplasm of unspecified site of unspecified female breast: Secondary | ICD-10-CM

## 2013-04-08 DIAGNOSIS — G43909 Migraine, unspecified, not intractable, without status migrainosus: Secondary | ICD-10-CM

## 2013-04-08 DIAGNOSIS — E559 Vitamin D deficiency, unspecified: Secondary | ICD-10-CM

## 2013-04-08 DIAGNOSIS — E7800 Pure hypercholesterolemia, unspecified: Secondary | ICD-10-CM

## 2013-04-08 DIAGNOSIS — M542 Cervicalgia: Secondary | ICD-10-CM

## 2013-04-08 DIAGNOSIS — Z9109 Other allergy status, other than to drugs and biological substances: Secondary | ICD-10-CM

## 2013-04-08 DIAGNOSIS — F3289 Other specified depressive episodes: Secondary | ICD-10-CM

## 2013-04-08 DIAGNOSIS — D72819 Decreased white blood cell count, unspecified: Secondary | ICD-10-CM

## 2013-04-08 DIAGNOSIS — F32A Depression, unspecified: Secondary | ICD-10-CM

## 2013-04-08 LAB — CBC WITH DIFFERENTIAL/PLATELET
BASOS ABS: 0 10*3/uL (ref 0.0–0.1)
Basophils Relative: 0.5 % (ref 0.0–3.0)
EOS ABS: 0.1 10*3/uL (ref 0.0–0.7)
Eosinophils Relative: 1.7 % (ref 0.0–5.0)
HCT: 38.3 % (ref 36.0–46.0)
HEMOGLOBIN: 12.8 g/dL (ref 12.0–15.0)
LYMPHS PCT: 36.6 % (ref 12.0–46.0)
Lymphs Abs: 1.6 10*3/uL (ref 0.7–4.0)
MCHC: 33.5 g/dL (ref 30.0–36.0)
MCV: 86.6 fl (ref 78.0–100.0)
Monocytes Absolute: 0.4 10*3/uL (ref 0.1–1.0)
Monocytes Relative: 8.6 % (ref 3.0–12.0)
NEUTROS ABS: 2.3 10*3/uL (ref 1.4–7.7)
Neutrophils Relative %: 52.6 % (ref 43.0–77.0)
Platelets: 197 10*3/uL (ref 150.0–400.0)
RBC: 4.42 Mil/uL (ref 3.87–5.11)
RDW: 12.7 % (ref 11.5–14.6)
WBC: 4.3 10*3/uL — ABNORMAL LOW (ref 4.5–10.5)

## 2013-04-08 MED ORDER — ETODOLAC 400 MG PO TABS: ORAL_TABLET | ORAL | Status: DC

## 2013-04-08 NOTE — Progress Notes (Signed)
Pre-visit discussion using our clinic review tool. No additional management support is needed unless otherwise documented below in the visit note.  

## 2013-04-09 ENCOUNTER — Encounter: Payer: Self-pay | Admitting: Internal Medicine

## 2013-04-11 ENCOUNTER — Encounter: Payer: Self-pay | Admitting: Internal Medicine

## 2013-04-11 DIAGNOSIS — M542 Cervicalgia: Secondary | ICD-10-CM | POA: Insufficient documentation

## 2013-04-11 DIAGNOSIS — D72819 Decreased white blood cell count, unspecified: Secondary | ICD-10-CM | POA: Insufficient documentation

## 2013-04-11 NOTE — Progress Notes (Signed)
  Subjective:    Patient ID: Brianna Gomez, female    DOB: May 20, 1965, 48 y.o.   MRN: [id number]  HPI 48 year old female with past history of breast cancer s/p chemotherapy, XRT and herceptin.  She comes in today for a scheduled follow up.   She states she is doing well.  No chest pain or tightness.  Breathing stable.  No acid reflux.  Bowels doing well.  No period.  No bowel change.  Sees Dr Alinda Money at [facility] for her breast cancer.  Had her mammogram recently. Recommended a f/u 6 month mammogram.  Sees Dr Donella Stade once a year.  Had described a sharp pain - left breast.  See last note for details.  Is better.  Still occasionally will notice some minimal discomfort.   No acid reflux.  Eating and drinking well.  She does report some right shoulder and neck pain and discomfort.  Hurts to turn her head.  Describes a pulling sensation.  Takes Ibuprofen and tylenol.  Started zoloft after her last visit.  Doing better.  Feels this dose is adequate.     Past Medical History  Diagnosis Date  . Migraines   . Depression     related to cancer diagnosis  . Vitamin D deficiency   . Breast cancer     s/p lumpectomy, segmental mastectomy, chemotherapy and XRT. s/p herceptin.     Outpatient Encounter Prescriptions as of 04/08/2013  Medication Sig  . cholecalciferol (VITAMIN D) 1000 UNITS tablet Take 1,000 Units by mouth daily.  . sertraline (ZOLOFT) 50 MG tablet Take 1 tablet (50 mg total) by mouth daily.  . TURMERIC PO Take by mouth.  . etodolac (LODINE) 400 MG tablet One tablet twice a day prn Take with food    Review of Systems Patient denies any headache, lightheadedness or dizziness.  No significant sinus or allergy symptoms.   No chest pain, tightness or palpitations.  No increased shortness of breath, cough or congestion.  No nausea or vomiting.  No acid reflux. No abdominal pain or cramping.  No bowel change, such as diarrhea, constipation, BRBPR or melana.  No urine change.  Sharp pain in the left breast -  described last visit.  Better.  Increased stress related to her medical testing.  On zoloft.  Better.  Neck and shoulder pain as outlined.        Objective:   Physical Exam  Filed Vitals:   04/08/13 1133  BP: 108/68  Pulse: 80  Temp: 98.5 F (68.87 C)   48 year old female in no acute distress.   HEENT:  Nares- clear.  Oropharynx - without lesions. NECK:  Supple.  Nontender.  No audible bruit.  HEART:  Appears to be regular. LUNGS:  No crackles or wheezing audible.  Respirations even and unlabored.  RADIAL PULSE:  Equal bilaterally.  ABDOMEN:  Soft, nontender.  Bowel sounds present and normal.  No audible abdominal bruit.  EXTREMITIES:  No increased edema present.  DP pulses palpable and equal bilaterally.           Assessment & Plan:  FATIGUE.  Still present but stable.  Follow.    HEALTH MAINTENANCE.  Physical 02/25/13.  Pap 02/24/12 negative with negative HPV.  Up to date with mammogram.  Just had f/u mammogram.  Recommended f/u mammogram in 6 months.

## 2013-04-11 NOTE — Assessment & Plan Note (Signed)
Recheck vitamin  D level today.  

## 2013-04-11 NOTE — Assessment & Plan Note (Signed)
Neck and shoulder pain as outlined.  Lodine as directed.  Follow.

## 2013-04-11 NOTE — Assessment & Plan Note (Signed)
Low cholesterol diet and exercise.  Follow.  Recheck lipid panel today.

## 2013-04-11 NOTE — Assessment & Plan Note (Signed)
Controlled.  

## 2013-04-11 NOTE — Assessment & Plan Note (Signed)
Slightly decreased white blood cell count.  Recheck cbc today.

## 2013-04-11 NOTE — Assessment & Plan Note (Signed)
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  Has been doing relatively well.  Some increased anxiety and stress related to testing and her job.  On zoloft.  Doing better.  Follow.     

## 2013-04-11 NOTE — Assessment & Plan Note (Signed)
Was related to her menstrual cycle.  Since her ablation, the headaches have not been a significant issue.  Follow.

## 2013-04-11 NOTE — Assessment & Plan Note (Signed)
S/p mastectomy (segmental), chemotherapy, XRT and herceptin.  Sees Dr Muss at [facility].  Just had f/u mammogram.  Recommended f/u mammogram in 6 months.    

## 2013-04-15 ENCOUNTER — Ambulatory Visit: Payer: BC Managed Care – PPO | Admitting: Internal Medicine

## 2013-05-13 ENCOUNTER — Ambulatory Visit: Payer: Self-pay | Admitting: Internal Medicine

## 2013-06-04 ENCOUNTER — Ambulatory Visit: Payer: Self-pay | Admitting: Internal Medicine

## 2013-06-13 ENCOUNTER — Other Ambulatory Visit: Payer: Self-pay | Admitting: Internal Medicine

## 2013-08-16 IMAGING — NM NUCLEAR MEDICINE CARDIAC MULTIPLE UPTAKE GATED ACQUISITION SCAN
4 series · 24 of 24 positions shown · non-contrast
Comparison: none

REASON FOR EXAM: pre chemo hx of breast CA
COMMENTS:

[Series 1000: ant-gated · 3.30mm/px · 6 of 24 frames shown]
[frame 3/24]
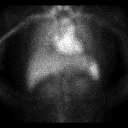
[frame 7/24]
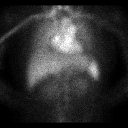
[frame 11/24]
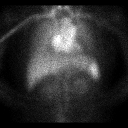
[frame 15/24]
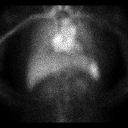
[frame 19/24]
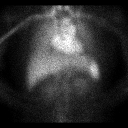
[frame 23/24]
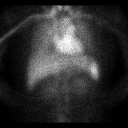

[Series 1000: lao 70-gated · 3.30mm/px · 6 of 24 frames shown]
[frame 3/24]
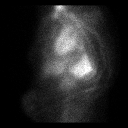
[frame 7/24]
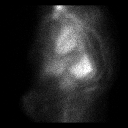
[frame 11/24]
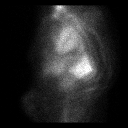
[frame 15/24]
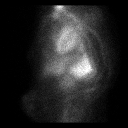
[frame 19/24]
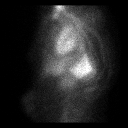
[frame 23/24]
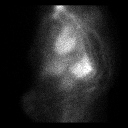

[Series 1000: lao 45-gated (results) · 3.30mm/px · 6 of 24 frames shown]
[frame 3/24]
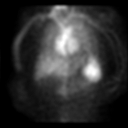
[frame 7/24]
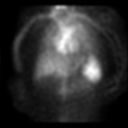
[frame 11/24]
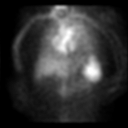
[frame 15/24]
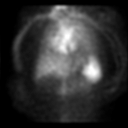
[frame 19/24]
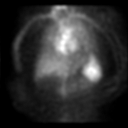
[frame 23/24]
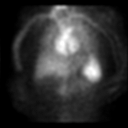

[Series 1000: lao 45-gated · 3.30mm/px · 6 of 24 frames shown]
[frame 3/24]
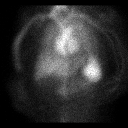
[frame 7/24]
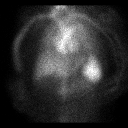
[frame 11/24]
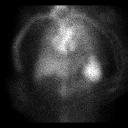
[frame 15/24]
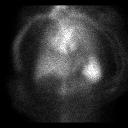
[frame 19/24]
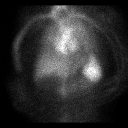
[frame 23/24]
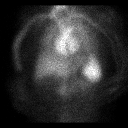

[24 of 24 positions shown; findings below may reference images not displayed]

PROCEDURE:     KNM - KNM REST MUGA SCAN [DATE] OF [DATE]  - [DATE] [DATE] [DATE]  [DATE]

RESULT:     Following intravenous administration of 24.97 mCi technetium 99m
Pertechnetate and 3.0 ml of PYP, Rest MUGA Scan was performed. Review of the
left ventricular Cine loop shows no akinetic or dyskinetic myocardial
segments.

The left ventricular ejection fraction measures 71.2% which is in the normal
range.
IMPRESSION: Normal study. The left ventricular ejection fraction
measures 71.2%.

## 2013-08-23 ENCOUNTER — Other Ambulatory Visit: Payer: BC Managed Care – PPO

## 2013-08-26 ENCOUNTER — Ambulatory Visit: Payer: BC Managed Care – PPO | Admitting: Internal Medicine

## 2013-08-30 ENCOUNTER — Other Ambulatory Visit: Payer: BC Managed Care – PPO

## 2013-10-04 ENCOUNTER — Other Ambulatory Visit: Payer: Self-pay | Admitting: Internal Medicine

## 2013-10-04 NOTE — Telephone Encounter (Signed)
Last refill 7.26.15, last OV 3.5.15, 4 cancellations since last OV.  Please advise refill

## 2013-10-04 NOTE — Telephone Encounter (Signed)
I will refill #[phone] with one refill, but she needs a call and let her know that she needs to keep the scheduled appt in 10/15.

## 2013-11-08 ENCOUNTER — Ambulatory Visit (INDEPENDENT_AMBULATORY_CARE_PROVIDER_SITE_OTHER): Payer: BC Managed Care – PPO | Admitting: Internal Medicine

## 2013-11-08 ENCOUNTER — Encounter: Payer: Self-pay | Admitting: Internal Medicine

## 2013-11-08 VITALS — BP 110/70 | HR 82 | Temp 98.3°F | Ht 60.8 in | Wt 147.8 lb

## 2013-11-08 DIAGNOSIS — C50919 Malignant neoplasm of unspecified site of unspecified female breast: Secondary | ICD-10-CM

## 2013-11-08 DIAGNOSIS — D72819 Decreased white blood cell count, unspecified: Secondary | ICD-10-CM

## 2013-11-08 DIAGNOSIS — F32A Depression, unspecified: Secondary | ICD-10-CM

## 2013-11-08 DIAGNOSIS — F329 Major depressive disorder, single episode, unspecified: Secondary | ICD-10-CM

## 2013-11-08 DIAGNOSIS — R5383 Other fatigue: Secondary | ICD-10-CM

## 2013-11-08 DIAGNOSIS — E7800 Pure hypercholesterolemia, unspecified: Secondary | ICD-10-CM

## 2013-11-08 MED ORDER — SERTRALINE HCL 50 MG PO TABS: 50.0000 mg | ORAL_TABLET | Freq: Every day | ORAL | Status: DC

## 2013-11-08 NOTE — Progress Notes (Signed)
Pre visit review using our clinic review tool, if applicable. No additional management support is needed unless otherwise documented below in the visit note. 

## 2013-11-10 ENCOUNTER — Encounter: Payer: Self-pay | Admitting: Internal Medicine

## 2013-11-14 ENCOUNTER — Encounter: Payer: Self-pay | Admitting: Internal Medicine

## 2013-11-14 DIAGNOSIS — R5383 Other fatigue: Secondary | ICD-10-CM | POA: Insufficient documentation

## 2013-11-14 NOTE — Assessment & Plan Note (Signed)
S/p mastectomy (segmental), chemotherapy, XRT and herceptin.  Sees Dr Alinda Money at [facility].  Just had f/u mammogram.  Recommended f/u mammogram in 6 months.

## 2013-11-14 NOTE — Assessment & Plan Note (Signed)
Slightly decreased white blood cell count.  Follow cbc.

## 2013-11-14 NOTE — Assessment & Plan Note (Signed)
Low cholesterol diet and exercise.  Follow.  Recheck lipid panel with next fasting labs.

## 2013-11-14 NOTE — Progress Notes (Signed)
  Subjective:    Patient ID: Brianna Gomez, female    DOB: February 03, 1966, 48 y.o.   MRN: [id number]  HPI 48 year old female with past history of breast cancer s/p chemotherapy, XRT and herceptin.  She comes in today for a scheduled follow up.   She states she is doing relatively well.  Does report some fatigue.   No chest pain or tightness.  Breathing stable.  No acid reflux.  Bowels doing well.  No period.  No bowel change.  Sees Dr Alinda Money at [facility] for her breast cancer.  Had her mammogram recently. Recommended a f/u 6 month mammogram.  She is due 02/2014.  Sees Dr Donella Stade once a year.  No acid reflux.  Eating and drinking well.  On zoloft.   Doing well with current dose.  Some increased stress with her breast cancer diagnosis.     Past Medical History  Diagnosis Date  . Migraines   . Depression     related to cancer diagnosis  . Vitamin D deficiency   . Breast cancer     s/p lumpectomy, segmental mastectomy, chemotherapy and XRT. s/p herceptin.     Outpatient Encounter Prescriptions as of 11/08/2013  Medication Sig  . cholecalciferol (VITAMIN D) 1000 UNITS tablet Take 1,000 Units by mouth daily.  . sertraline (ZOLOFT) 50 MG tablet Take 1 tablet (50 mg total) by mouth daily.  . TURMERIC PO Take by mouth.  . [DISCONTINUED] etodolac (LODINE) 400 MG tablet One tablet twice a day prn Take with food  . [DISCONTINUED] sertraline (ZOLOFT) 50 MG tablet TAKE ONE TABLET BY MOUTH ONCE DAILY    Review of Systems Patient denies any headache, lightheadedness or dizziness.  No significant sinus or allergy symptoms.   No chest pain, tightness or palpitations.  No increased shortness of breath, cough or congestion.  No nausea or vomiting.  No acid reflux. No abdominal pain or cramping.  No bowel change, such as diarrhea, constipation, BRBPR or melana.  No urine change.  Has a maternal aunt with Raynaud's/CREST.  Maternal aunt with pancreatic cancer.   Increased fatigue as outlined          Objective:   Physical Exam  Filed Vitals:   11/08/13 1500  BP: 110/70  Pulse: 82  Temp: 98.3 F (36.8 C)   Pulse recheck:  85  48 year old female in no acute distress.   HEENT:  Nares- clear.  Oropharynx - without lesions. NECK:  Supple.  Nontender.  No audible bruit.  HEART:  Appears to be regular. LUNGS:  No crackles or wheezing audible.  Respirations even and unlabored.  RADIAL PULSE:  Equal bilaterally.  ABDOMEN:  Soft, nontender.  Bowel sounds present and normal.  No audible abdominal bruit.  EXTREMITIES:  No increased edema present.  DP pulses palpable and equal bilaterally.           Assessment & Plan:  HEALTH MAINTENANCE.  Physical 02/25/13.  Pap 02/24/12 negative with negative HPV.  Up to date with mammogram.  Just had f/u mammogram.  Recommended f/u mammogram in 6 months.  Due 02/2014 for mammogram.

## 2013-11-14 NOTE — Assessment & Plan Note (Signed)
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  Has been doing relatively well.  Some increased anxiety and stress related to testing and her job.  On zoloft.  Doing better.  Follow.

## 2013-11-14 NOTE — Assessment & Plan Note (Signed)
Increased fatigue reported.  May be multifactorial.  Check cbc, met c and tsh.

## 2013-11-22 ENCOUNTER — Other Ambulatory Visit (INDEPENDENT_AMBULATORY_CARE_PROVIDER_SITE_OTHER): Payer: BC Managed Care – PPO

## 2013-11-22 DIAGNOSIS — D72819 Decreased white blood cell count, unspecified: Secondary | ICD-10-CM

## 2013-11-22 DIAGNOSIS — R5383 Other fatigue: Secondary | ICD-10-CM

## 2013-11-22 DIAGNOSIS — E7800 Pure hypercholesterolemia, unspecified: Secondary | ICD-10-CM

## 2013-11-22 LAB — LIPID PANEL
Cholesterol: 233 mg/dL — ABNORMAL HIGH (ref 0–200)
HDL: 38.8 mg/dL — ABNORMAL LOW (ref >39.00)
LDL CALC: 168 mg/dL — AB (ref 0–99)
NonHDL: 194.2
Total CHOL/HDL Ratio: 6
Triglycerides: 132 mg/dL (ref 0.0–149.0)
VLDL: 26.4 mg/dL (ref 0.0–40.0)

## 2013-11-22 LAB — COMPREHENSIVE METABOLIC PANEL
ALBUMIN: 3.7 g/dL (ref 3.5–5.2)
ALK PHOS: 70 U/L (ref 39–117)
ALT: 18 U/L (ref 0–35)
AST: 22 U/L (ref 0–37)
BILIRUBIN TOTAL: 0.6 mg/dL (ref 0.2–1.2)
BUN: 16 mg/dL (ref 6–23)
CO2: 26 mEq/L (ref 19–32)
Calcium: 9.2 mg/dL (ref 8.4–10.5)
Chloride: 107 mEq/L (ref 96–112)
Creatinine, Ser: 1 mg/dL (ref 0.4–1.2)
GFR: 61.43 mL/min (ref >60.00)
Glucose, Bld: 95 mg/dL (ref 70–99)
POTASSIUM: 4.1 meq/L (ref 3.5–5.1)
Sodium: 140 mEq/L (ref 135–145)
Total Protein: 7.1 g/dL (ref 6.0–8.3)

## 2013-11-22 LAB — CBC WITH DIFFERENTIAL/PLATELET
BASOS PCT: 0.3 % (ref 0.0–3.0)
Basophils Absolute: 0 10*3/uL (ref 0.0–0.1)
EOS PCT: 1.8 % (ref 0.0–5.0)
Eosinophils Absolute: 0.1 10*3/uL (ref 0.0–0.7)
HCT: 42 % (ref 36.0–46.0)
HEMOGLOBIN: 13.8 g/dL (ref 12.0–15.0)
LYMPHS ABS: 1.6 10*3/uL (ref 0.7–4.0)
LYMPHS PCT: 36.4 % (ref 12.0–46.0)
MCHC: 32.7 g/dL (ref 30.0–36.0)
MCV: 86.3 fl (ref 78.0–100.0)
MONOS PCT: 7.1 % (ref 3.0–12.0)
Monocytes Absolute: 0.3 10*3/uL (ref 0.1–1.0)
NEUTROS ABS: 2.4 10*3/uL (ref 1.4–7.7)
Neutrophils Relative %: 54.4 % (ref 43.0–77.0)
Platelets: 218 10*3/uL (ref 150.0–400.0)
RBC: 4.87 Mil/uL (ref 3.87–5.11)
RDW: 12.9 % (ref 11.5–15.5)
WBC: 4.4 10*3/uL (ref 4.0–10.5)

## 2013-11-22 LAB — TSH: TSH: 4.7 u[IU]/mL — ABNORMAL HIGH (ref 0.35–4.50)

## 2013-11-23 ENCOUNTER — Telehealth: Payer: Self-pay | Admitting: Internal Medicine

## 2013-11-23 ENCOUNTER — Encounter: Payer: Self-pay | Admitting: Internal Medicine

## 2013-11-23 DIAGNOSIS — R7989 Other specified abnormal findings of blood chemistry: Secondary | ICD-10-CM

## 2013-11-23 NOTE — Telephone Encounter (Signed)
Pt notified of lab results via my chart.  Needs a non fasting lab appt in 4-6 weeks.  Please schedule and contact her with a lab appt date and time.  Thanks.    Dr Nicki Reaper

## 2013-11-25 NOTE — Telephone Encounter (Signed)
Unread mychart message mailed to patient 

## 2014-01-06 ENCOUNTER — Encounter: Payer: Self-pay | Admitting: Internal Medicine

## 2014-01-06 ENCOUNTER — Other Ambulatory Visit (INDEPENDENT_AMBULATORY_CARE_PROVIDER_SITE_OTHER): Payer: BC Managed Care – PPO

## 2014-01-06 DIAGNOSIS — R946 Abnormal results of thyroid function studies: Secondary | ICD-10-CM

## 2014-01-06 DIAGNOSIS — R7989 Other specified abnormal findings of blood chemistry: Secondary | ICD-10-CM

## 2014-01-06 LAB — TSH: TSH: 3.25 u[IU]/mL (ref 0.35–4.50)

## 2014-02-09 ENCOUNTER — Encounter: Payer: Self-pay | Admitting: Internal Medicine

## 2014-02-10 ENCOUNTER — Ambulatory Visit (INDEPENDENT_AMBULATORY_CARE_PROVIDER_SITE_OTHER): Payer: BLUE CROSS/BLUE SHIELD | Admitting: Internal Medicine

## 2014-02-10 ENCOUNTER — Encounter: Payer: Self-pay | Admitting: Internal Medicine

## 2014-02-10 VITALS — BP 127/80 | HR 86 | Temp 98.2°F | Ht 60.8 in

## 2014-02-10 DIAGNOSIS — J069 Acute upper respiratory infection, unspecified: Secondary | ICD-10-CM

## 2014-02-10 MED ORDER — AZITHROMYCIN 250 MG PO TABS: ORAL_TABLET | ORAL | Status: DC

## 2014-02-10 MED ORDER — ALBUTEROL SULFATE HFA 108 (90 BASE) MCG/ACT IN AERS: 2.0000 | INHALATION_SPRAY | Freq: Four times a day (QID) | RESPIRATORY_TRACT | Status: DC | PRN

## 2014-02-10 NOTE — Patient Instructions (Signed)
Saline nasal spray - flush nose at lease 2-3x/day  Nasacort - nasal spray - 2 sprays each nostril one time per day.    mucinex in the am and robitussin DM in the pm

## 2014-02-10 NOTE — Progress Notes (Signed)
Pre visit review using our clinic review tool, if applicable. No additional management support is needed unless otherwise documented below in the visit note. 

## 2014-02-13 ENCOUNTER — Encounter: Payer: Self-pay | Admitting: Internal Medicine

## 2014-02-13 DIAGNOSIS — J069 Acute upper respiratory infection, unspecified: Secondary | ICD-10-CM | POA: Insufficient documentation

## 2014-02-13 NOTE — Progress Notes (Signed)
   Subjective:    Patient ID: Brianna Gomez, female    DOB: 16-Aug-1965, 49 y.o.   MRN: [id number]  HPI 49 year old female with past history of breast cancer who comes in today as a work in with concerns regarding increased sinus pressure, nasal congestion and cough.  States symptoms started approximately one week ago.  Has taken some mucinex.  Used vicks.  No vomiting.  Previous chills.  No sob.  No wheezing.  No diarrhea.     Past Medical History  Diagnosis Date  . Migraines   . Depression     related to cancer diagnosis  . Vitamin D deficiency   . Breast cancer     s/p lumpectomy, segmental mastectomy, chemotherapy and XRT. s/p herceptin.     Current Outpatient Prescriptions on File Prior to Visit  Medication Sig Dispense Refill  . cholecalciferol (VITAMIN D) 1000 UNITS tablet Take 1,000 Units by mouth daily.    . sertraline (ZOLOFT) 50 MG tablet Take 1 tablet (50 mg total) by mouth daily. 30 tablet 5  . TURMERIC PO Take by mouth.     No current facility-administered medications on file prior to visit.    Review of Systems Patient denies any headache, lightheadedness or dizziness.  No fever documented.  Some chills.  No chest pain, tightness or palpitations.  No increased shortness of breath or wheezing.  Does report increased cough.  Increased sinus pressure and nasal congestion.   No nausea or vomiting.   No bowel change, such as diarrhea.  Used Vicks and mucinex.       Objective:   Physical Exam Filed Vitals:   02/10/14 1551  BP: 127/80  Pulse: 86  Temp: 98.2 F (9.66 C)   49 year old female in no acute distress.   HEENT:  Nares- slightly erythematous turbinates.   Oropharynx - without lesions.  TMs visualized - without erythema.   NECK:  Supple.  Nontender.    HEART:  Appears to be regular. LUNGS:  No crackles or wheezing audible.  Respirations even and unlabored.   Increased cough with forced expiration.           Assessment & Plan:  1. URI (upper respiratory  infection) Symptoms and exam as outlined.  Saline nasal spray and nasacort nasal spray as directed.  Mucinex in the am and robitussin in the evening.  Albuterol inhaler if needed.  Will give her a zpak to have if symptoms progress.  Follow.  Rest.  Fluids.

## 2014-05-10 ENCOUNTER — Encounter: Payer: BC Managed Care – PPO | Admitting: Internal Medicine

## 2014-05-24 ENCOUNTER — Ambulatory Visit
Admit: 2014-05-24 | Disposition: A | Discharge status: Home / Self Care | Payer: Self-pay | Attending: Radiation Oncology | Admitting: Radiation Oncology

## 2014-06-24 ENCOUNTER — Encounter: Payer: Self-pay | Admitting: Internal Medicine

## 2014-06-24 MED ORDER — SERTRALINE HCL 50 MG PO TABS: 50.0000 mg | ORAL_TABLET | Freq: Every day | ORAL | Status: DC

## 2014-06-24 NOTE — Telephone Encounter (Signed)
Ok to send 3 month supply? See mychart msg

## 2014-09-09 ENCOUNTER — Encounter: Payer: Self-pay | Admitting: Internal Medicine

## 2014-09-09 ENCOUNTER — Ambulatory Visit (INDEPENDENT_AMBULATORY_CARE_PROVIDER_SITE_OTHER): Payer: BLUE CROSS/BLUE SHIELD | Admitting: Internal Medicine

## 2014-09-09 VITALS — BP 118/72 | HR 70 | Temp 98.3°F | Wt 139.2 lb

## 2014-09-09 DIAGNOSIS — D72819 Decreased white blood cell count, unspecified: Secondary | ICD-10-CM | POA: Diagnosis not present

## 2014-09-09 DIAGNOSIS — C50919 Malignant neoplasm of unspecified site of unspecified female breast: Secondary | ICD-10-CM

## 2014-09-09 DIAGNOSIS — F32A Depression, unspecified: Secondary | ICD-10-CM

## 2014-09-09 DIAGNOSIS — F329 Major depressive disorder, single episode, unspecified: Secondary | ICD-10-CM

## 2014-09-09 DIAGNOSIS — E559 Vitamin D deficiency, unspecified: Secondary | ICD-10-CM

## 2014-09-09 DIAGNOSIS — E7800 Pure hypercholesterolemia, unspecified: Secondary | ICD-10-CM

## 2014-09-09 MED ORDER — SERTRALINE HCL 50 MG PO TABS: 50.0000 mg | ORAL_TABLET | Freq: Every day | ORAL | Status: DC

## 2014-09-09 NOTE — Progress Notes (Signed)
Pre visit review using our clinic review tool, if applicable. No additional management support is needed unless otherwise documented below in the visit note. 

## 2014-09-09 NOTE — Progress Notes (Signed)
Patient ID: Brianna Gomez, female   DOB: May 08, 1965, 49 y.o.   MRN: [id number]   Subjective:    Patient ID: Brianna Gomez, female    DOB: January 11, 1966, 49 y.o.   MRN: [id number]  HPI  Patient here for a scheduled follow up.  She has changed jobs.  Likes her new job.  Decreased stress.  zoloft working well.  Eating and drinking well.  Has adjusted her diet.  Lost some weight.  Had recent breast biopsy - negatvie.  No acid reflux.  Bowels stable.  No nausea or vomiting.     Past Medical History  Diagnosis Date  . Migraines   . Depression     related to cancer diagnosis  . Vitamin D deficiency   . Breast cancer     s/p lumpectomy, segmental mastectomy, chemotherapy and XRT. s/p herceptin.     Outpatient Encounter Prescriptions as of 09/09/2014  Medication Sig  . albuterol (PROVENTIL HFA;VENTOLIN HFA) 108 (90 BASE) MCG/ACT inhaler Inhale 2 puffs into the lungs every 6 (six) hours as needed for wheezing or shortness of breath. (Patient not taking: Reported on 09/09/2014)  . azithromycin (ZITHROMAX) 250 MG tablet Take two tablets x 1 day and then one tablet per day for four more days. (Patient not taking: Reported on 09/09/2014)  . cholecalciferol (VITAMIN D) 1000 UNITS tablet Take 1,000 Units by mouth daily.  . sertraline (ZOLOFT) 50 MG tablet Take 1 tablet (50 mg total) by mouth daily.  . TURMERIC PO Take by mouth.  . [DISCONTINUED] sertraline (ZOLOFT) 50 MG tablet Take 1 tablet (50 mg total) by mouth daily.   No facility-administered encounter medications on file as of 09/09/2014.    Review of Systems  Constitutional: Negative for appetite change and unexpected weight change.  HENT: Negative for congestion and sinus pressure.   Respiratory: Negative for cough, chest tightness and shortness of breath.   Cardiovascular: Negative for chest pain, palpitations and leg swelling.  Gastrointestinal: Negative for nausea, vomiting, abdominal pain and diarrhea.  Skin: Negative for color change and  rash.  Neurological: Negative for dizziness, light-headedness and headaches.  Psychiatric/Behavioral: Negative for dysphoric mood and agitation.       Objective:     Blood pressure recheck:  112/72  Physical Exam  Constitutional: She appears well-developed and well-nourished. No distress.  HENT:  Nose: Nose normal.  Mouth/Throat: Oropharynx is clear and moist.  Neck: Neck supple. No thyromegaly present.  Cardiovascular: Normal rate and regular rhythm.   Pulmonary/Chest: Breath sounds normal. No respiratory distress. She has no wheezes.  Abdominal: Soft. Bowel sounds are normal. There is no tenderness.  Musculoskeletal: She exhibits no edema or tenderness.  Lymphadenopathy:    She has no cervical adenopathy.  Skin: No rash noted. No erythema.  Psychiatric: She has a normal mood and affect. Her behavior is normal.    BP 118/72 mmHg  Pulse 70  Temp(Src) 98.3 F (36.8 C) (Oral)  Wt 139 lb 3.2 oz (63.141 kg)  SpO2 98% Wt Readings from Last 3 Encounters:  09/09/14 139 lb 3.2 oz (63.141 kg)  11/08/13 147 lb 12 oz (67.019 kg)  04/08/13 140 lb 4 oz (63.617 kg)     Lab Results  Component Value Date   WBC 4.4 11/22/2013   HGB 13.8 11/22/2013   HCT 42.0 11/22/2013   PLT 218.0 11/22/2013   GLUCOSE 95 11/22/2013   CHOL 233* 11/22/2013   TRIG 132.0 11/22/2013   HDL 38.80* 11/22/2013   LDLDIRECT 160.6 02/25/2013  LDLCALC 168* 11/22/2013   ALT 18 11/22/2013   AST 22 11/22/2013   NA 140 11/22/2013   K 4.1 11/22/2013   CL 107 11/22/2013   CREATININE 1.0 11/22/2013   BUN 16 11/22/2013   CO2 26 11/22/2013   TSH 3.25 01/06/2014       Assessment & Plan:   Problem List Items Addressed This Visit    Breast cancer - Primary    Just had biopsy in 03/2014.  Benign.  Due f/u in 10/2014.  Followed by Dr Alinda Money at [facility].        Depression    Has a history of depression (reactive) - related to her breast cancer diagnosis.  Has been doing better.  Stress at work better.  On zoloft.   Continue.        Relevant Medications   sertraline (ZOLOFT) 50 MG tablet   Hypercholesterolemia    Low cholesterol diet and exercise.  Follow lipid panel.  Will hold on checking until he next appt.        Leukopenia    White count wnl - 10/15.        Vitamin D deficiency    Follow vitamin D level.            Einar Pheasant, MD

## 2014-09-11 ENCOUNTER — Encounter: Payer: Self-pay | Admitting: Internal Medicine

## 2014-09-11 NOTE — Assessment & Plan Note (Signed)
White count wnl - 10/15.

## 2014-09-11 NOTE — Assessment & Plan Note (Signed)
Follow vitamin D level.  

## 2014-09-11 NOTE — Assessment & Plan Note (Signed)
Just had biopsy in 03/2014.  Benign.  Due f/u in 10/2014.  Followed by Dr Alinda Money at [facility].

## 2014-09-11 NOTE — Assessment & Plan Note (Signed)
Low cholesterol diet and exercise.  Follow lipid panel.  Will hold on checking until he next appt.

## 2014-09-11 NOTE — Assessment & Plan Note (Signed)
Has a history of depression (reactive) - related to her breast cancer diagnosis.  Has been doing better.  Stress at work better.  On zoloft.  Continue.

## 2014-12-06 ENCOUNTER — Encounter: Payer: Self-pay | Admitting: Internal Medicine

## 2015-03-06 ENCOUNTER — Encounter: Payer: Self-pay | Admitting: Internal Medicine

## 2015-03-06 ENCOUNTER — Other Ambulatory Visit (HOSPITAL_COMMUNITY)
Admission: RE | Admit: 2015-03-06 | Discharge: 2015-03-06 | Disposition: A | Discharge status: Home / Self Care | Payer: BLUE CROSS/BLUE SHIELD | Source: Ambulatory Visit | Attending: Internal Medicine | Admitting: Internal Medicine

## 2015-03-06 ENCOUNTER — Ambulatory Visit (INDEPENDENT_AMBULATORY_CARE_PROVIDER_SITE_OTHER): Payer: BLUE CROSS/BLUE SHIELD | Admitting: Internal Medicine

## 2015-03-06 VITALS — BP 108/70 | HR 75 | Temp 98.5°F | Ht 61.0 in | Wt 146.0 lb

## 2015-03-06 DIAGNOSIS — D72819 Decreased white blood cell count, unspecified: Secondary | ICD-10-CM

## 2015-03-06 DIAGNOSIS — E559 Vitamin D deficiency, unspecified: Secondary | ICD-10-CM | POA: Diagnosis not present

## 2015-03-06 DIAGNOSIS — Z01419 Encounter for gynecological examination (general) (routine) without abnormal findings: Secondary | ICD-10-CM | POA: Insufficient documentation

## 2015-03-06 DIAGNOSIS — F329 Major depressive disorder, single episode, unspecified: Secondary | ICD-10-CM

## 2015-03-06 DIAGNOSIS — E7800 Pure hypercholesterolemia, unspecified: Secondary | ICD-10-CM

## 2015-03-06 DIAGNOSIS — Z0001 Encounter for general adult medical examination with abnormal findings: Secondary | ICD-10-CM | POA: Diagnosis not present

## 2015-03-06 DIAGNOSIS — Z1151 Encounter for screening for human papillomavirus (HPV): Secondary | ICD-10-CM | POA: Diagnosis present

## 2015-03-06 DIAGNOSIS — Z0000 Encounter for general adult medical examination without abnormal findings: Secondary | ICD-10-CM

## 2015-03-06 DIAGNOSIS — F32A Depression, unspecified: Secondary | ICD-10-CM

## 2015-03-06 DIAGNOSIS — M79622 Pain in left upper arm: Secondary | ICD-10-CM

## 2015-03-06 DIAGNOSIS — C50919 Malignant neoplasm of unspecified site of unspecified female breast: Secondary | ICD-10-CM | POA: Diagnosis not present

## 2015-03-06 LAB — CBC WITH DIFFERENTIAL/PLATELET
BASOS ABS: 0 10*3/uL (ref 0.0–0.1)
Basophils Relative: 0.5 % (ref 0.0–3.0)
EOS ABS: 0.1 10*3/uL (ref 0.0–0.7)
Eosinophils Relative: 1.1 % (ref 0.0–5.0)
HEMATOCRIT: 40.3 % (ref 36.0–46.0)
HEMOGLOBIN: 13.5 g/dL (ref 12.0–15.0)
LYMPHS PCT: 31.9 % (ref 12.0–46.0)
Lymphs Abs: 1.4 10*3/uL (ref 0.7–4.0)
MCHC: 33.5 g/dL (ref 30.0–36.0)
MCV: 85.8 fl (ref 78.0–100.0)
Monocytes Absolute: 0.3 10*3/uL (ref 0.1–1.0)
Monocytes Relative: 7.5 % (ref 3.0–12.0)
Neutro Abs: 2.6 10*3/uL (ref 1.4–7.7)
Neutrophils Relative %: 59 % (ref 43.0–77.0)
Platelets: 229 10*3/uL (ref 150.0–400.0)
RBC: 4.7 Mil/uL (ref 3.87–5.11)
RDW: 12.9 % (ref 11.5–15.5)
WBC: 4.5 10*3/uL (ref 4.0–10.5)

## 2015-03-06 LAB — BASIC METABOLIC PANEL
BUN: 15 mg/dL (ref 6–23)
CALCIUM: 9 mg/dL (ref 8.4–10.5)
CO2: 26 mEq/L (ref 19–32)
Chloride: 109 mEq/L (ref 96–112)
Creatinine, Ser: 0.83 mg/dL (ref 0.40–1.20)
GFR: 77.51 mL/min (ref >60.00)
GLUCOSE: 101 mg/dL — AB (ref 70–99)
Potassium: 4 mEq/L (ref 3.5–5.1)
SODIUM: 142 meq/L (ref 135–145)

## 2015-03-06 LAB — LIPID PANEL
CHOLESTEROL: 208 mg/dL — AB (ref 0–200)
HDL: 43.5 mg/dL (ref >39.00)
LDL CALC: 141 mg/dL — AB (ref 0–99)
NonHDL: 164.51
TRIGLYCERIDES: 116 mg/dL (ref 0.0–149.0)
Total CHOL/HDL Ratio: 5
VLDL: 23.2 mg/dL (ref 0.0–40.0)

## 2015-03-06 LAB — HEPATIC FUNCTION PANEL
ALT: 13 U/L (ref 0–35)
AST: 15 U/L (ref 0–37)
Albumin: 4.1 g/dL (ref 3.5–5.2)
Alkaline Phosphatase: 61 U/L (ref 39–117)
BILIRUBIN DIRECT: 0 mg/dL (ref 0.0–0.3)
TOTAL PROTEIN: 6.5 g/dL (ref 6.0–8.3)
Total Bilirubin: 0.4 mg/dL (ref 0.2–1.2)

## 2015-03-06 LAB — VITAMIN D 25 HYDROXY (VIT D DEFICIENCY, FRACTURES): VITD: 27.67 ng/mL — AB (ref 30.00–100.00)

## 2015-03-06 LAB — TSH: TSH: 2.44 u[IU]/mL (ref 0.35–4.50)

## 2015-03-06 NOTE — Assessment & Plan Note (Signed)
Previous low white count.  Recheck cbc today.

## 2015-03-06 NOTE — Assessment & Plan Note (Signed)
Symptoms and exam as outlined.  Reproducible on exam.  Tylenol as directed.  Follow.  Notify me if persistent.

## 2015-03-06 NOTE — Assessment & Plan Note (Signed)
Had biopsy 03/2014 - benign.  Saw Dr Alinda Money in 10/2014.  Due f/u next month. Mammogram scheduled for 03/2015.

## 2015-03-06 NOTE — Assessment & Plan Note (Signed)
Have not checked vitamin D level since 01/2014.  Recheck today.  Continue supplements.

## 2015-03-06 NOTE — Progress Notes (Signed)
Patient ID: Brianna Gomez, female   DOB: 07-28-1965, 49 y.o.   MRN: [id number]   Subjective:    Patient ID: Brianna Gomez, female    DOB: [birth year], 50 y.o.   MRN: [id number]  HPI  Patient with past history of breast cancer and depression.  She comes in today to follow up on these issues as well as for a complete physical exam.  She is due in February to f/u with her oncologist and to have her f/u mammogram.  No breast changes that she is aware of.  Tries to stay active.  No cardiac symptoms with increased activity or exertion.  No sob.  No acid reflux reported.  Increased stress recently. Her daughter's fiancee was the victim of a hit and run.  She is home now and doing ok.  Handling stress relatively well.  No abdominal pain or cramping.  Bowels stable.  No urinary or vaginal complaints.  She does report some muscle soreness in the left upper arm.  No known injury or trauma.  Has been sore for two months.  Soreness with palpation.  No pain with moving her arm.     Past Medical History  Diagnosis Date  . Migraines   . Depression     related to cancer diagnosis  . Vitamin D deficiency   . Breast cancer [facility])     s/p lumpectomy, segmental mastectomy, chemotherapy and XRT. s/p herceptin.    Past Surgical History  Procedure Laterality Date  . Tubal ligation    . Lasik      Both Eyes  . Uterine  2010    Ablation  . Breast surgery      segmental mastectomy (port a cath placement)  . Breast lumpectomy  03/15/10  . Port-a-cath removal  05/11/10   Family History  Problem Relation Age of Onset  . Hypertension Mother   . Uterine cancer Maternal Grandmother   . Colon cancer Neg Hx    Social History   Social History  . Marital Status: Married    Spouse Name: N/A  . Number of Children: 3  . Years of Education: N/A   Social History Main Topics  . Smoking status: Never Smoker   . Smokeless tobacco: Never Used  . Alcohol Use: 0.0 oz/week    0 Standard drinks or equivalent per week       Comment: rare  . Drug Use: No  . Sexual Activity: Not Asked   Other Topics Concern  . None   Social History Narrative    Outpatient Encounter Prescriptions as of 03/06/2015  Medication Sig  . cholecalciferol (VITAMIN D) 1000 UNITS tablet Take 1,000 Units by mouth daily.  . sertraline (ZOLOFT) 50 MG tablet Take 1 tablet (50 mg total) by mouth daily.  . TURMERIC PO Take by mouth.  . [DISCONTINUED] albuterol (PROVENTIL HFA;VENTOLIN HFA) 108 (90 BASE) MCG/ACT inhaler Inhale 2 puffs into the lungs every 6 (six) hours as needed for wheezing or shortness of breath.  . [DISCONTINUED] azithromycin (ZITHROMAX) 250 MG tablet Take two tablets x 1 day and then one tablet per day for four more days.   No facility-administered encounter medications on file as of 03/06/2015.    Review of Systems  Constitutional: Negative for appetite change and unexpected weight change.  HENT: Negative for congestion and sinus pressure.   Eyes: Negative for pain and visual disturbance.  Respiratory: Negative for cough, chest tightness and shortness of breath.   Cardiovascular: Negative for chest pain,  palpitations and leg swelling.  Gastrointestinal: Negative for nausea, vomiting, abdominal pain and diarrhea.  Genitourinary: Negative for dysuria and difficulty urinating.  Musculoskeletal: Negative for back pain and joint swelling.       Left upper arm pain.  Muscle soreness.  No bruising.  No known trauma.    Skin: Negative for color change and rash.  Neurological: Negative for dizziness, light-headedness and headaches.  Hematological: Negative for adenopathy.  Psychiatric/Behavioral: Negative for dysphoric mood and agitation.       Increased stress as outlined.         Objective:    Physical Exam  Constitutional: She is oriented to person, place, and time. She appears well-developed and well-nourished. No distress.  HENT:  Nose: Nose normal.  Mouth/Throat: Oropharynx is clear and moist.  Eyes: Right  eye exhibits no discharge. Left eye exhibits no discharge. No scleral icterus.  Neck: Neck supple. No thyromegaly present.  Cardiovascular: Normal rate and regular rhythm.   Pulmonary/Chest: Breath sounds normal. No accessory muscle usage. No tachypnea. No respiratory distress. She has no decreased breath sounds. She has no wheezes. She has no rhonchi. Right breast exhibits no inverted nipple, no mass, no nipple discharge and no tenderness (no axillary adenopathy). Left breast exhibits no inverted nipple, no mass, no nipple discharge and no tenderness (no axilarry adenopathy).  Well healed incision site - left breast.  Abdominal: Soft. Bowel sounds are normal. There is no tenderness.  Genitourinary:  Normal external genitalia.  Vaginal vault without lesions.  Cervix identified.  Pap smear performed.  Could not appreciate any adnexal masses or tenderness.    Musculoskeletal: She exhibits no edema or tenderness.  Tenderness to palpation over the left deltoid.  No mass.  No hematoma.  No swelling.    Lymphadenopathy:    She has no cervical adenopathy.  Neurological: She is alert and oriented to person, place, and time.  Skin: Skin is warm. No rash noted. No erythema.  Psychiatric: She has a normal mood and affect. Her behavior is normal.    BP 108/70 mmHg  Pulse 75  Temp(Src) 98.5 F (36.9 C) (Oral)  Ht 5\' 1"  (1.549 m)  Wt 146 lb (66.225 kg)  BMI 27.60 kg/m2  SpO2 98% Wt Readings from Last 3 Encounters:  03/06/15 146 lb (66.225 kg)  09/09/14 139 lb 3.2 oz (63.141 kg)  11/08/13 147 lb 12 oz (67.019 kg)     Lab Results  Component Value Date   WBC 4.4 11/22/2013   HGB 13.8 11/22/2013   HCT 42.0 11/22/2013   PLT 218.0 11/22/2013   GLUCOSE 95 11/22/2013   CHOL 233* 11/22/2013   TRIG 132.0 11/22/2013   HDL 38.80* 11/22/2013   LDLDIRECT 160.6 02/25/2013   LDLCALC 168* 11/22/2013   ALT 18 11/22/2013   AST 22 11/22/2013   NA 140 11/22/2013   K 4.1 11/22/2013   CL 107 11/22/2013    CREATININE 1.0 11/22/2013   BUN 16 11/22/2013   CO2 26 11/22/2013   TSH 3.25 01/06/2014       Assessment & Plan:   Problem List Items Addressed This Visit    Breast cancer ([place])    Had biopsy 03/2014 - benign.  Saw Dr Alinda Money in 10/2014.  Due f/u next month. Mammogram scheduled for 03/2015.        Relevant Orders   Hepatic function panel   Basic metabolic panel   Depression    Has a history of reactive depression - related to her breast  cancer.  Doing well on zoloft.  Continue.  Increased stress recently.  Handling stress well.        Relevant Orders   TSH   Health care maintenance    Physical today 03/06/15.  PAP 03/06/15.  Mammogram scheduled for next month.        Hypercholesterolemia    Low cholesterol diet and exercise.  Follow lipid panel.        Relevant Orders   Lipid panel   Left upper arm pain    Symptoms and exam as outlined.  Reproducible on exam.  Tylenol as directed.  Follow.  Notify me if persistent.        Leukopenia    Previous low white count.  Recheck cbc today.        Relevant Orders   CBC with Differential/Platelet   Vitamin D deficiency - Primary    Have not checked vitamin D level since 01/2014.  Recheck today.  Continue supplements.        Relevant Orders   VITAMIN D 25 Hydroxy (Vit-D Deficiency, Fractures)       Einar Pheasant, MD

## 2015-03-06 NOTE — Assessment & Plan Note (Signed)
Low cholesterol diet and exercise.  Follow lipid panel.   

## 2015-03-06 NOTE — Assessment & Plan Note (Signed)
Physical today 03/06/15.  PAP 03/06/15.  Mammogram scheduled for next month.

## 2015-03-06 NOTE — Assessment & Plan Note (Signed)
Has a history of reactive depression - related to her breast cancer.  Doing well on zoloft.  Continue.  Increased stress recently.  Handling stress well.

## 2015-03-07 ENCOUNTER — Encounter: Payer: Self-pay | Admitting: Internal Medicine

## 2015-03-07 LAB — CYTOLOGY - PAP

## 2015-03-08 ENCOUNTER — Encounter: Payer: Self-pay | Admitting: Internal Medicine

## 2015-04-03 LAB — HM MAMMOGRAPHY

## 2015-04-12 ENCOUNTER — Encounter: Payer: Self-pay | Admitting: Internal Medicine

## 2015-04-12 MED ORDER — OSELTAMIVIR PHOSPHATE 75 MG PO CAPS: ORAL_CAPSULE | ORAL | Status: DC

## 2015-04-12 NOTE — Telephone Encounter (Signed)
Spoke to Brianna Gomez.  She does not have any symptoms currently.  In house with both husband and daughter.  Called in tamiflu prophylaxis.

## 2015-05-10 ENCOUNTER — Encounter: Payer: Self-pay | Admitting: Internal Medicine

## 2015-06-28 ENCOUNTER — Other Ambulatory Visit: Payer: Self-pay | Admitting: Internal Medicine

## 2015-09-04 ENCOUNTER — Encounter: Payer: Self-pay | Admitting: Internal Medicine

## 2015-09-04 ENCOUNTER — Ambulatory Visit (INDEPENDENT_AMBULATORY_CARE_PROVIDER_SITE_OTHER): Payer: BLUE CROSS/BLUE SHIELD | Admitting: Internal Medicine

## 2015-09-04 VITALS — BP 110/80 | HR 72 | Temp 98.7°F | Resp 18 | Ht 61.0 in | Wt 154.4 lb

## 2015-09-04 DIAGNOSIS — E559 Vitamin D deficiency, unspecified: Secondary | ICD-10-CM | POA: Diagnosis not present

## 2015-09-04 DIAGNOSIS — C50919 Malignant neoplasm of unspecified site of unspecified female breast: Secondary | ICD-10-CM | POA: Diagnosis not present

## 2015-09-04 DIAGNOSIS — F329 Major depressive disorder, single episode, unspecified: Secondary | ICD-10-CM | POA: Diagnosis not present

## 2015-09-04 DIAGNOSIS — E7800 Pure hypercholesterolemia, unspecified: Secondary | ICD-10-CM

## 2015-09-04 DIAGNOSIS — F32A Depression, unspecified: Secondary | ICD-10-CM

## 2015-09-04 DIAGNOSIS — R635 Abnormal weight gain: Secondary | ICD-10-CM | POA: Insufficient documentation

## 2015-09-04 LAB — LIPID PANEL
CHOLESTEROL: 241 mg/dL — AB (ref 0–200)
HDL: 47.3 mg/dL (ref >39.00)
LDL Cholesterol: 162 mg/dL — ABNORMAL HIGH (ref 0–99)
NONHDL: 193.55
TRIGLYCERIDES: 160 mg/dL — AB (ref 0.0–149.0)
Total CHOL/HDL Ratio: 5
VLDL: 32 mg/dL (ref 0.0–40.0)

## 2015-09-04 LAB — TSH: TSH: 3.18 u[IU]/mL (ref 0.35–4.50)

## 2015-09-04 MED ORDER — SERTRALINE HCL 50 MG PO TABS: 50.0000 mg | ORAL_TABLET | Freq: Every day | ORAL | 1 refills | Status: DC

## 2015-09-04 NOTE — Progress Notes (Signed)
Patient ID: Brianna Gomez, female   DOB: 20-Dec-1965, 50 y.o.   MRN: [id number]   Subjective:    Patient ID: Brianna Gomez, female    DOB: 01-05-1966, 50 y.o.   MRN: [id number]  HPI  Patient here for a scheduled follow up. Followed by Dr Alinda Money [facility]) for her history of breast cancer.  Had mammogram 04/03/15 - recommended f/u mammogram in one year.  Planned f/u in 6 months after visit.  She has f/u appt scheduled at the end of august.  She has gained weight.  States has not changed her diet that much.  No increased stress currently.  She does an eliptical machine at work.  Also walks into work from the bus.  Feels she is getting exercise.  Discussed nutritionist.  She has one she can see through her work.  No increased sob.  No abdominal pain or cramping.  Bowels stable.     Past Medical History:  Diagnosis Date  . Breast cancer [facility])    s/p lumpectomy, segmental mastectomy, chemotherapy and XRT. s/p herceptin.   . Depression    related to cancer diagnosis  . Migraines   . Vitamin D deficiency    Past Surgical History:  Procedure Laterality Date  . BREAST LUMPECTOMY  03/15/10  . BREAST SURGERY     segmental mastectomy (port a cath placement)  . LASIK     Both Eyes  . PORT-A-CATH REMOVAL  05/11/10  . TUBAL LIGATION    . Uterine  2010   Ablation   Family History  Problem Relation Age of Onset  . Hypertension Mother   . Uterine cancer Maternal Grandmother   . Colon cancer Neg Hx    Social History   Social History  . Marital status: Married    Spouse name: N/A  . Number of children: 3  . Years of education: N/A   Social History Main Topics  . Smoking status: Never Smoker  . Smokeless tobacco: Never Used  . Alcohol use 0.0 oz/week     Comment: rare  . Drug use: No  . Sexual activity: Not Asked   Other Topics Concern  . None   Social History Narrative  . None    Outpatient Encounter Prescriptions as of 09/04/2015  Medication Sig  . cholecalciferol (VITAMIN D) 1000  UNITS tablet Take 1,000 Units by mouth daily.  . sertraline (ZOLOFT) 50 MG tablet Take 1 tablet (50 mg total) by mouth daily.  . TURMERIC PO Take by mouth.  . [DISCONTINUED] oseltamivir (TAMIFLU) 75 MG capsule Take one capsule q day for 10 days  . [DISCONTINUED] sertraline (ZOLOFT) 50 MG tablet TAKE 1 TABLET DAILY   No facility-administered encounter medications on file as of 09/04/2015.     Review of Systems  Constitutional: Negative for appetite change.       Has gained weight.   HENT: Negative for congestion and sinus pressure.   Respiratory: Negative for cough, chest tightness and shortness of breath.   Cardiovascular: Negative for chest pain, palpitations and leg swelling.  Gastrointestinal: Negative for abdominal pain, diarrhea, nausea and vomiting.  Genitourinary: Negative for difficulty urinating and dysuria.  Musculoskeletal: Negative for back pain and joint swelling.  Skin: Negative for color change and rash.  Neurological: Negative for dizziness, light-headedness and headaches.  Psychiatric/Behavioral: Negative for agitation and dysphoric mood.       Objective:     Blood pressure rechecked by me:  112/78  Physical Exam  Constitutional: She appears well-developed  and well-nourished. No distress.  HENT:  Nose: Nose normal.  Mouth/Throat: Oropharynx is clear and moist.  Neck: Neck supple. No thyromegaly present.  Cardiovascular: Normal rate and regular rhythm.   Pulmonary/Chest: Breath sounds normal. No respiratory distress. She has no wheezes.  Abdominal: Soft. Bowel sounds are normal. There is no tenderness.  Musculoskeletal: She exhibits no edema or tenderness.  Lymphadenopathy:    She has no cervical adenopathy.  Skin: No rash noted. No erythema.  Psychiatric: She has a normal mood and affect. Her behavior is normal.    BP 110/80 (BP Location: Right Arm, Patient Position: Sitting, Cuff Size: Large)   Pulse 72   Temp 98.7 F (37.1 C) (Oral)   Resp 18   Ht 5'  1" (1.549 m)   Wt 154 lb 6 oz (70 kg)   SpO2 98%   BMI 29.17 kg/m  Wt Readings from Last 3 Encounters:  09/04/15 154 lb 6 oz (70 kg)  03/06/15 146 lb (66.2 kg)  09/09/14 139 lb 3.2 oz (63.1 kg)     Lab Results  Component Value Date   WBC 4.5 03/06/2015   HGB 13.5 03/06/2015   HCT 40.3 03/06/2015   PLT 229.0 03/06/2015   GLUCOSE 101 (H) 03/06/2015   CHOL 208 (H) 03/06/2015   TRIG 116.0 03/06/2015   HDL 43.50 03/06/2015   LDLDIRECT 160.6 02/25/2013   LDLCALC 141 (H) 03/06/2015   ALT 13 03/06/2015   AST 15 03/06/2015   NA 142 03/06/2015   K 4.0 03/06/2015   CL 109 03/06/2015   CREATININE 0.83 03/06/2015   BUN 15 03/06/2015   CO2 26 03/06/2015   TSH 2.44 03/06/2015       Assessment & Plan:   Problem List Items Addressed This Visit    Breast cancer [facility])    Has an appt with Dr Alinda Money an the end of august.  Had mammogram 03/2015 - ok.        Depression    Doing well on zoloft.  Continue.  Feels she is handling stress relatively well.        Relevant Medications   sertraline (ZOLOFT) 50 MG tablet   Hypercholesterolemia - Primary    Low cholesterol diet and exercise.  Follow lipid panel.  Recheck today.       Relevant Orders   Lipid panel   TSH   Vitamin D deficiency    Continue vitamin D supplements.  Follow vitamin D level.        Weight gain    Discussed with her today.  Discussed diet adjustment and exercise.  Information given.  Check tsh.         Other Visit Diagnoses   None.      Einar Pheasant, MD

## 2015-09-04 NOTE — Assessment & Plan Note (Signed)
Low cholesterol diet and exercise.  Follow lipid panel.  Recheck today.  °

## 2015-09-04 NOTE — Assessment & Plan Note (Signed)
Continue vitamin D supplements.  Follow vitamin D level.   

## 2015-09-04 NOTE — Progress Notes (Signed)
Pre-visit discussion using our clinic review tool. No additional management support is needed unless otherwise documented below in the visit note.  

## 2015-09-04 NOTE — Assessment & Plan Note (Signed)
Discussed with her today.  Discussed diet adjustment and exercise.  Information given.  Check tsh.

## 2015-09-04 NOTE — Assessment & Plan Note (Signed)
Has an appt with Dr Alinda Money an the end of august.  Had mammogram 03/2015 - ok.

## 2015-09-04 NOTE — Assessment & Plan Note (Signed)
Doing well on zoloft.  Continue.  Feels she is handling stress relatively well.

## 2015-09-06 ENCOUNTER — Encounter: Payer: Self-pay | Admitting: Internal Medicine

## 2015-10-02 DIAGNOSIS — C50912 Malignant neoplasm of unspecified site of left female breast: Secondary | ICD-10-CM | POA: Diagnosis not present

## 2015-10-02 DIAGNOSIS — Z853 Personal history of malignant neoplasm of breast: Secondary | ICD-10-CM | POA: Diagnosis not present

## 2015-10-02 DIAGNOSIS — Z6825 Body mass index (BMI) 25.0-25.9, adult: Secondary | ICD-10-CM | POA: Diagnosis not present

## 2015-10-02 DIAGNOSIS — C50919 Malignant neoplasm of unspecified site of unspecified female breast: Secondary | ICD-10-CM | POA: Diagnosis not present

## 2015-11-08 ENCOUNTER — Encounter: Payer: Self-pay | Admitting: Internal Medicine

## 2016-02-07 DIAGNOSIS — J019 Acute sinusitis, unspecified: Secondary | ICD-10-CM | POA: Diagnosis not present

## 2016-02-07 DIAGNOSIS — B9689 Other specified bacterial agents as the cause of diseases classified elsewhere: Secondary | ICD-10-CM | POA: Diagnosis not present

## 2016-02-07 DIAGNOSIS — L219 Seborrheic dermatitis, unspecified: Secondary | ICD-10-CM | POA: Diagnosis not present

## 2016-03-07 ENCOUNTER — Ambulatory Visit (INDEPENDENT_AMBULATORY_CARE_PROVIDER_SITE_OTHER): Payer: BLUE CROSS/BLUE SHIELD | Admitting: Internal Medicine

## 2016-03-07 ENCOUNTER — Encounter: Payer: Self-pay | Admitting: Internal Medicine

## 2016-03-07 VITALS — BP 124/82 | HR 86 | Temp 98.6°F | Resp 18 | Ht 61.0 in | Wt 152.8 lb

## 2016-03-07 DIAGNOSIS — D72819 Decreased white blood cell count, unspecified: Secondary | ICD-10-CM

## 2016-03-07 DIAGNOSIS — R21 Rash and other nonspecific skin eruption: Secondary | ICD-10-CM

## 2016-03-07 DIAGNOSIS — R635 Abnormal weight gain: Secondary | ICD-10-CM

## 2016-03-07 DIAGNOSIS — E559 Vitamin D deficiency, unspecified: Secondary | ICD-10-CM

## 2016-03-07 DIAGNOSIS — F32A Depression, unspecified: Secondary | ICD-10-CM

## 2016-03-07 DIAGNOSIS — C50919 Malignant neoplasm of unspecified site of unspecified female breast: Secondary | ICD-10-CM | POA: Diagnosis not present

## 2016-03-07 DIAGNOSIS — F329 Major depressive disorder, single episode, unspecified: Secondary | ICD-10-CM

## 2016-03-07 DIAGNOSIS — E7800 Pure hypercholesterolemia, unspecified: Secondary | ICD-10-CM

## 2016-03-07 DIAGNOSIS — Z0000 Encounter for general adult medical examination without abnormal findings: Secondary | ICD-10-CM | POA: Diagnosis not present

## 2016-03-07 LAB — CBC WITH DIFFERENTIAL/PLATELET
BASOS PCT: 0.6 % (ref 0.0–3.0)
Basophils Absolute: 0 10*3/uL (ref 0.0–0.1)
EOS PCT: 1.8 % (ref 0.0–5.0)
Eosinophils Absolute: 0.1 10*3/uL (ref 0.0–0.7)
HCT: 40.4 % (ref 36.0–46.0)
HEMOGLOBIN: 13.7 g/dL (ref 12.0–15.0)
Lymphocytes Relative: 32.1 % (ref 12.0–46.0)
Lymphs Abs: 1.5 10*3/uL (ref 0.7–4.0)
MCHC: 33.9 g/dL (ref 30.0–36.0)
MCV: 84 fl (ref 78.0–100.0)
MONO ABS: 0.3 10*3/uL (ref 0.1–1.0)
Monocytes Relative: 6.3 % (ref 3.0–12.0)
NEUTROS ABS: 2.7 10*3/uL (ref 1.4–7.7)
Neutrophils Relative %: 59.2 % (ref 43.0–77.0)
PLATELETS: 235 10*3/uL (ref 150.0–400.0)
RBC: 4.81 Mil/uL (ref 3.87–5.11)
RDW: 13.3 % (ref 11.5–15.5)
WBC: 4.6 10*3/uL (ref 4.0–10.5)

## 2016-03-07 LAB — BASIC METABOLIC PANEL
BUN: 14 mg/dL (ref 6–23)
CO2: 27 meq/L (ref 19–32)
Calcium: 9.3 mg/dL (ref 8.4–10.5)
Chloride: 109 mEq/L (ref 96–112)
Creatinine, Ser: 0.88 mg/dL (ref 0.40–1.20)
GFR: 72.16 mL/min (ref >60.00)
GLUCOSE: 94 mg/dL (ref 70–99)
POTASSIUM: 4.3 meq/L (ref 3.5–5.1)
SODIUM: 141 meq/L (ref 135–145)

## 2016-03-07 LAB — TSH: TSH: 2.44 u[IU]/mL (ref 0.35–4.50)

## 2016-03-07 LAB — HEPATIC FUNCTION PANEL
ALT: 38 U/L — AB (ref 0–35)
AST: 16 U/L (ref 0–37)
Albumin: 4.3 g/dL (ref 3.5–5.2)
Alkaline Phosphatase: 68 U/L (ref 39–117)
BILIRUBIN TOTAL: 0.5 mg/dL (ref 0.2–1.2)
Bilirubin, Direct: 0.1 mg/dL (ref 0.0–0.3)
TOTAL PROTEIN: 6.2 g/dL (ref 6.0–8.3)

## 2016-03-07 LAB — VITAMIN D 25 HYDROXY (VIT D DEFICIENCY, FRACTURES): VITD: 31.82 ng/mL (ref 30.00–100.00)

## 2016-03-07 LAB — LIPID PANEL
CHOLESTEROL: 259 mg/dL — AB (ref 0–200)
HDL: 47 mg/dL (ref >39.00)
LDL CALC: 190 mg/dL — AB (ref 0–99)
NonHDL: 211.75
TRIGLYCERIDES: 108 mg/dL (ref 0.0–149.0)
Total CHOL/HDL Ratio: 6
VLDL: 21.6 mg/dL (ref 0.0–40.0)

## 2016-03-07 MED ORDER — MUPIROCIN 2 % EX OINT: TOPICAL_OINTMENT | CUTANEOUS | 0 refills | Status: DC

## 2016-03-07 NOTE — Progress Notes (Signed)
Pre-visit discussion using our clinic review tool. No additional management support is needed unless otherwise documented below in the visit note.  

## 2016-03-07 NOTE — Progress Notes (Signed)
Patient ID: Brianna Gomez, female   DOB: [birth year], 51 y.o.   MRN: [id number]   Subjective:    Patient ID: Brianna Gomez, female    DOB: 1965-02-19, 51 y.o.   MRN: [id number]  HPI  Patient here for her physical exam.  She has a history of breast cancer and hypercholesterolemia.  Sees Dr Alinda Money at [facility] for f/u of her breast cancer.  Due f/u in 03/2106.  Last seen in 09/2016.  Stable.  She was seen recently at urgent care for rash behind her left ear.  Was givent steroid cream.  She brings picture of the previous rash.  Had vesicles present.  She was questioning the possibility of shingles.  She is now weeks out and still has some rash present.  Feels dry.  Some itching and some irritation.  Steroid cream did not help.  She was diagnosed with sinus infection as well ( at urgent care ).  Placed on augmentin.  Sinus symptoms are better.  No cough or congestion.  Breathing stable.  She has started exercising.  Has decreased carb intake.  Eating more protein and vegetables.  States she walks one mile bid.  Started this after Thanksgiving.  Off zoloft.  Feels she is doing well off the medication.  Wants to try something for weight loss.     Past Medical History:  Diagnosis Date  . Breast cancer [facility])    s/p lumpectomy, segmental mastectomy, chemotherapy and XRT. s/p herceptin.   . Depression    related to cancer diagnosis  . Migraines   . Vitamin D deficiency    Past Surgical History:  Procedure Laterality Date  . BREAST LUMPECTOMY  03/15/10  . BREAST SURGERY     segmental mastectomy (port a cath placement)  . LASIK     Both Eyes  . PORT-A-CATH REMOVAL  05/11/10  . TUBAL LIGATION    . Uterine  2010   Ablation   Family History  Problem Relation Age of Onset  . Hypertension Mother   . Uterine cancer Maternal Grandmother   . Colon cancer Neg Hx    Social History   Social History  . Marital status: Married    Spouse name: N/A  . Number of children: 3  . Years of education: N/A   Social  History Main Topics  . Smoking status: Never Smoker  . Smokeless tobacco: Never Used  . Alcohol use 0.0 oz/week     Comment: rare  . Drug use: No  . Sexual activity: Not Asked   Other Topics Concern  . None   Social History Narrative  . None    Outpatient Encounter Prescriptions as of 03/07/2016  Medication Sig  . cholecalciferol (VITAMIN D) 1000 UNITS tablet Take 1,000 Units by mouth daily.  . TURMERIC PO Take by mouth.  . mupirocin ointment (BACTROBAN) 2 % Apply to affected area bid  . [DISCONTINUED] sertraline (ZOLOFT) 50 MG tablet Take 1 tablet (50 mg total) by mouth daily.   No facility-administered encounter medications on file as of 03/07/2016.     Review of Systems  Constitutional: Negative for appetite change and unexpected weight change.  HENT: Negative for congestion and sinus pressure.   Eyes: Negative for pain and visual disturbance.  Respiratory: Negative for cough, chest tightness and shortness of breath.   Cardiovascular: Negative for chest pain, palpitations and leg swelling.  Gastrointestinal: Negative for abdominal pain, diarrhea, nausea and vomiting.  Genitourinary: Negative for difficulty urinating and dysuria.  Musculoskeletal:  Negative for back pain and joint swelling.  Skin: Negative for color change.       Erythematous rash behind her left ear.  No vesicles.    Neurological: Negative for dizziness, light-headedness and headaches.  Hematological: Negative for adenopathy. Does not bruise/bleed easily.  Psychiatric/Behavioral: Negative for agitation and dysphoric mood.       Objective:    Physical Exam  Constitutional: She appears well-developed and well-nourished. No distress.  HENT:  Nose: Nose normal.  Mouth/Throat: Oropharynx is clear and moist.  Neck: Neck supple. No thyromegaly present.  Cardiovascular: Normal rate and regular rhythm.   Pulmonary/Chest: Breath sounds normal. No respiratory distress. She has no wheezes.  Abdominal: Soft.  Bowel sounds are normal. There is no tenderness.  Musculoskeletal: She exhibits no edema or tenderness.  Lymphadenopathy:    She has no cervical adenopathy.  Skin: Rash noted.  Erythematous rash behind left ear.   Psychiatric: She has a normal mood and affect. Her behavior is normal.    BP 124/82 (BP Location: Right Arm, Patient Position: Sitting, Cuff Size: Large)   Pulse 86   Temp 98.6 F (37 C) (Oral)   Resp 18   Ht 5\' 1"  (1.549 m)   Wt 152 lb 12.8 oz (69.3 kg)   SpO2 99%   BMI 28.87 kg/m  Wt Readings from Last 3 Encounters:  03/07/16 152 lb 12.8 oz (69.3 kg)  09/04/15 154 lb 6 oz (70 kg)  03/06/15 146 lb (66.2 kg)     Lab Results  Component Value Date   WBC 4.6 03/07/2016   HGB 13.7 03/07/2016   HCT 40.4 03/07/2016   PLT 235.0 03/07/2016   GLUCOSE 94 03/07/2016   CHOL 259 (H) 03/07/2016   TRIG 108.0 03/07/2016   HDL 47.00 03/07/2016   LDLDIRECT 160.6 02/25/2013   LDLCALC 190 (H) 03/07/2016   ALT 38 (H) 03/07/2016   AST 16 03/07/2016   NA 141 03/07/2016   K 4.3 03/07/2016   CL 109 03/07/2016   CREATININE 0.88 03/07/2016   BUN 14 03/07/2016   CO2 27 03/07/2016   TSH 2.44 03/07/2016       Assessment & Plan:   Problem List Items Addressed This Visit    Breast cancer ([place])    Last evaluated 10/02/15 - mammogram 04/03/15 - recommended f/u in 6 months.  Scheduled for f/u mammogram 04/03/16.        Depression    Off zoloft.  Does not need.  Doing well.  Follow.       Health care maintenance    Physical today 03/07/16.  PAP 03/06/15 negative with negative HPV.  Mammogram scheduled for 04/01/16.  Discussed the need for colonoscopy - age 64.  She will notify me when agreeable.       Hypercholesterolemia    Low cholesterol diet and exercise.  Follow lipid panel.        Relevant Orders   CBC with Differential/Platelet (Completed)   Hepatic function panel (Completed)   Lipid panel (Completed)   TSH (Completed)   Basic metabolic panel (Completed)   Leukopenia      Follow cbc.  Recheck today.       Rash    Persistent rash behind her left ear.  Previously noticed vesicles.  She was questioning the possibility of shingles.  Rash now present for weeks.  Some erythema.  Will have her use bactroban as directed.  No definite evidence of shingles today.  Steroid cream did not help.  Will  have ENT evaluate to confirm no residual ear issues if previous rash was shingles.  Pt in agreement.        Relevant Orders   Ambulatory referral to ENT   Vitamin D deficiency    Continue vitamin D supplements.  Recheck vitamin D level.       Relevant Orders   VITAMIN D 25 Hydroxy (Vit-D Deficiency, Fractures) (Completed)   Weight gain    Discussed diet and exercise.  Discussed treatment options including saxenda.   She has adjusted her diet.  Has been exercising.  We discussed increased exercise.  Will check labs first.  Further treatment pending lab results.            Einar Pheasant, MD

## 2016-03-07 NOTE — Assessment & Plan Note (Addendum)
Physical today 03/07/16.  PAP 03/06/15 negative with negative HPV.  Mammogram scheduled for 04/01/16.  Discussed the need for colonoscopy - age 51.  She will notify me when agreeable.

## 2016-03-10 ENCOUNTER — Encounter: Payer: Self-pay | Admitting: Internal Medicine

## 2016-03-10 DIAGNOSIS — R21 Rash and other nonspecific skin eruption: Secondary | ICD-10-CM | POA: Insufficient documentation

## 2016-03-10 NOTE — Assessment & Plan Note (Signed)
Follow cbc.  Recheck today.   

## 2016-03-10 NOTE — Assessment & Plan Note (Signed)
Continue vitamin D supplements.  Recheck vitamin D level.

## 2016-03-10 NOTE — Assessment & Plan Note (Signed)
Low cholesterol diet and exercise.  Follow lipid panel.   

## 2016-03-10 NOTE — Assessment & Plan Note (Signed)
Persistent rash behind her left ear.  Previously noticed vesicles.  She was questioning the possibility of shingles.  Rash now present for weeks.  Some erythema.  Will have her use bactroban as directed.  No definite evidence of shingles today.  Steroid cream did not help.  Will have ENT evaluate to confirm no residual ear issues if previous rash was shingles.  Pt in agreement.

## 2016-03-10 NOTE — Assessment & Plan Note (Signed)
Off zoloft.  Does not need.  Doing well.  Follow.

## 2016-03-10 NOTE — Assessment & Plan Note (Signed)
Discussed diet and exercise.  Discussed treatment options including saxenda.   She has adjusted her diet.  Has been exercising.  We discussed increased exercise.  Will check labs first.  Further treatment pending lab results.

## 2016-03-10 NOTE — Assessment & Plan Note (Signed)
Last evaluated 10/02/15 - mammogram 04/03/15 - recommended f/u in 6 months.  Scheduled for f/u mammogram 04/03/16.

## 2016-03-12 ENCOUNTER — Other Ambulatory Visit: Payer: Self-pay | Admitting: Internal Medicine

## 2016-03-12 DIAGNOSIS — R945 Abnormal results of liver function studies: Secondary | ICD-10-CM

## 2016-03-12 DIAGNOSIS — R7989 Other specified abnormal findings of blood chemistry: Secondary | ICD-10-CM

## 2016-03-12 NOTE — Progress Notes (Signed)
Order placed for f/u lab.   

## 2016-03-14 ENCOUNTER — Encounter: Payer: Self-pay | Admitting: Internal Medicine

## 2016-03-14 NOTE — Telephone Encounter (Signed)
Please call pt and notify her of her lab results.  I think this is one that did not get notified of her results.  Also, let her know that I want to hold on doing any new medications right now - until f/u with the recheck liver.  Thanks

## 2016-03-15 NOTE — Telephone Encounter (Signed)
Lm to call office

## 2016-03-15 NOTE — Telephone Encounter (Signed)
Please notify pt of attached message and last lab result.  It appears she has not been notified of her labs (another one from that day).  See message.

## 2016-03-18 NOTE — Telephone Encounter (Signed)
Ok for her to have labs through oncology.  Does she need rx from [place] for the liver panel?

## 2016-03-18 NOTE — Telephone Encounter (Signed)
Spoke to patient she would like to know if she can have labs done by oncology on 2-26 so that she can limit her missed time at work.

## 2016-03-25 NOTE — Telephone Encounter (Signed)
Orders written on rx and placed in box.  Fax number attached.  See my chart message.

## 2016-03-27 ENCOUNTER — Other Ambulatory Visit: Payer: BLUE CROSS/BLUE SHIELD

## 2016-04-01 DIAGNOSIS — Z923 Personal history of irradiation: Secondary | ICD-10-CM | POA: Diagnosis not present

## 2016-04-01 DIAGNOSIS — Z79899 Other long term (current) drug therapy: Secondary | ICD-10-CM | POA: Diagnosis not present

## 2016-04-01 DIAGNOSIS — R922 Inconclusive mammogram: Secondary | ICD-10-CM | POA: Diagnosis not present

## 2016-04-01 DIAGNOSIS — Z9221 Personal history of antineoplastic chemotherapy: Secondary | ICD-10-CM | POA: Diagnosis not present

## 2016-04-01 DIAGNOSIS — Z853 Personal history of malignant neoplasm of breast: Secondary | ICD-10-CM | POA: Diagnosis not present

## 2016-04-01 DIAGNOSIS — Z9012 Acquired absence of left breast and nipple: Secondary | ICD-10-CM | POA: Diagnosis not present

## 2016-04-01 DIAGNOSIS — C50912 Malignant neoplasm of unspecified site of left female breast: Secondary | ICD-10-CM | POA: Diagnosis not present

## 2016-04-01 DIAGNOSIS — Z170 Estrogen receptor positive status [ER+]: Secondary | ICD-10-CM | POA: Diagnosis not present

## 2016-04-01 DIAGNOSIS — Z171 Estrogen receptor negative status [ER-]: Secondary | ICD-10-CM | POA: Diagnosis not present

## 2016-04-01 DIAGNOSIS — Z6825 Body mass index (BMI) 25.0-25.9, adult: Secondary | ICD-10-CM | POA: Diagnosis not present

## 2016-07-11 ENCOUNTER — Encounter: Payer: Self-pay | Admitting: Internal Medicine

## 2016-07-11 NOTE — Telephone Encounter (Signed)
Not on current med list, please advise, thanks

## 2016-07-12 MED ORDER — SERTRALINE HCL 50 MG PO TABS: 50.0000 mg | ORAL_TABLET | Freq: Every day | ORAL | 2 refills | Status: DC

## 2016-07-12 NOTE — Telephone Encounter (Signed)
rx sent in for zoloft 50mg  #30 with 2 refills.

## 2016-09-25 ENCOUNTER — Ambulatory Visit: Payer: BLUE CROSS/BLUE SHIELD | Admitting: Internal Medicine

## 2016-10-25 ENCOUNTER — Other Ambulatory Visit: Payer: Self-pay | Admitting: Internal Medicine

## 2016-10-25 NOTE — Telephone Encounter (Signed)
Last OV 2/18 ok to  Fill.

## 2016-10-26 NOTE — Telephone Encounter (Signed)
ok'd zoloft #90 with no refills.  She needs a f/u appt scheduled within the next month.  Thanks.

## 2016-10-28 NOTE — Telephone Encounter (Signed)
Left detailed message patient needs to schedule appointment.

## 2017-02-14 ENCOUNTER — Encounter: Payer: Self-pay | Admitting: Internal Medicine

## 2017-04-02 DIAGNOSIS — C50912 Malignant neoplasm of unspecified site of left female breast: Secondary | ICD-10-CM | POA: Diagnosis not present

## 2017-04-02 DIAGNOSIS — Z6822 Body mass index (BMI) 22.0-22.9, adult: Secondary | ICD-10-CM | POA: Diagnosis not present

## 2017-04-02 DIAGNOSIS — Z923 Personal history of irradiation: Secondary | ICD-10-CM | POA: Diagnosis not present

## 2017-04-02 DIAGNOSIS — Z171 Estrogen receptor negative status [ER-]: Secondary | ICD-10-CM | POA: Diagnosis not present

## 2017-04-02 DIAGNOSIS — F419 Anxiety disorder, unspecified: Secondary | ICD-10-CM | POA: Diagnosis not present

## 2017-04-02 DIAGNOSIS — Z9221 Personal history of antineoplastic chemotherapy: Secondary | ICD-10-CM | POA: Diagnosis not present

## 2017-04-02 DIAGNOSIS — F329 Major depressive disorder, single episode, unspecified: Secondary | ICD-10-CM | POA: Diagnosis not present

## 2017-05-07 ENCOUNTER — Other Ambulatory Visit: Payer: Self-pay | Admitting: Internal Medicine

## 2017-05-16 ENCOUNTER — Ambulatory Visit: Payer: BLUE CROSS/BLUE SHIELD | Admitting: Internal Medicine

## 2017-05-16 ENCOUNTER — Encounter: Payer: Self-pay | Admitting: Internal Medicine

## 2017-05-16 DIAGNOSIS — F329 Major depressive disorder, single episode, unspecified: Secondary | ICD-10-CM

## 2017-05-16 DIAGNOSIS — Z853 Personal history of malignant neoplasm of breast: Secondary | ICD-10-CM

## 2017-05-16 DIAGNOSIS — F32A Depression, unspecified: Secondary | ICD-10-CM

## 2017-05-16 DIAGNOSIS — R195 Other fecal abnormalities: Secondary | ICD-10-CM

## 2017-05-16 DIAGNOSIS — D72819 Decreased white blood cell count, unspecified: Secondary | ICD-10-CM

## 2017-05-16 DIAGNOSIS — E559 Vitamin D deficiency, unspecified: Secondary | ICD-10-CM

## 2017-05-16 DIAGNOSIS — E7800 Pure hypercholesterolemia, unspecified: Secondary | ICD-10-CM | POA: Diagnosis not present

## 2017-05-16 LAB — BASIC METABOLIC PANEL
BUN: 14 mg/dL (ref 6–23)
CO2: 25 meq/L (ref 19–32)
Calcium: 9.1 mg/dL (ref 8.4–10.5)
Chloride: 107 mEq/L (ref 96–112)
Creatinine, Ser: 0.87 mg/dL (ref 0.40–1.20)
GFR: 72.77 mL/min (ref >60.00)
GLUCOSE: 89 mg/dL (ref 70–99)
POTASSIUM: 4 meq/L (ref 3.5–5.1)
SODIUM: 140 meq/L (ref 135–145)

## 2017-05-16 LAB — CBC WITH DIFFERENTIAL/PLATELET
BASOS ABS: 0 10*3/uL (ref 0.0–0.1)
BASOS PCT: 0.5 % (ref 0.0–3.0)
EOS ABS: 0.1 10*3/uL (ref 0.0–0.7)
Eosinophils Relative: 1.5 % (ref 0.0–5.0)
HEMATOCRIT: 39.4 % (ref 36.0–46.0)
Hemoglobin: 13.6 g/dL (ref 12.0–15.0)
LYMPHS ABS: 1.4 10*3/uL (ref 0.7–4.0)
LYMPHS PCT: 38.9 % (ref 12.0–46.0)
MCHC: 34.6 g/dL (ref 30.0–36.0)
MCV: 82.9 fl (ref 78.0–100.0)
MONO ABS: 0.2 10*3/uL (ref 0.1–1.0)
Monocytes Relative: 6.6 % (ref 3.0–12.0)
NEUTROS PCT: 52.5 % (ref 43.0–77.0)
Neutro Abs: 1.9 10*3/uL (ref 1.4–7.7)
PLATELETS: 241 10*3/uL (ref 150.0–400.0)
RBC: 4.75 Mil/uL (ref 3.87–5.11)
RDW: 13.2 % (ref 11.5–15.5)
WBC: 3.7 10*3/uL — ABNORMAL LOW (ref 4.0–10.5)

## 2017-05-16 LAB — VITAMIN D 25 HYDROXY (VIT D DEFICIENCY, FRACTURES): VITD: 31.38 ng/mL (ref 30.00–100.00)

## 2017-05-16 LAB — LIPID PANEL
Cholesterol: 224 mg/dL — ABNORMAL HIGH (ref 0–200)
HDL: 46.9 mg/dL (ref >39.00)
LDL Cholesterol: 157 mg/dL — ABNORMAL HIGH (ref 0–99)
NONHDL: 176.77
TRIGLYCERIDES: 101 mg/dL (ref 0.0–149.0)
Total CHOL/HDL Ratio: 5
VLDL: 20.2 mg/dL (ref 0.0–40.0)

## 2017-05-16 LAB — HEPATIC FUNCTION PANEL
ALK PHOS: 52 U/L (ref 39–117)
ALT: 11 U/L (ref 0–35)
AST: 11 U/L (ref 0–37)
Albumin: 4.1 g/dL (ref 3.5–5.2)
BILIRUBIN DIRECT: 0.1 mg/dL (ref 0.0–0.3)
Total Bilirubin: 0.6 mg/dL (ref 0.2–1.2)
Total Protein: 6.5 g/dL (ref 6.0–8.3)

## 2017-05-16 LAB — TSH: TSH: 1.84 u[IU]/mL (ref 0.35–4.50)

## 2017-05-16 MED ORDER — SERTRALINE HCL 50 MG PO TABS: 50.0000 mg | ORAL_TABLET | Freq: Every day | ORAL | 1 refills | Status: DC

## 2017-05-16 NOTE — Progress Notes (Signed)
Patient ID: LATOYNA HIRD, female   DOB: 1965/02/12, 52 y.o.   MRN: [id number]   Subjective:    Patient ID: NIYATI HEINKE, female    DOB: 1965-08-02, 52 y.o.   MRN: [id number]  HPI  Patient here for a scheduled follow up.  She reports she is doing relatively well.  Some increased stress.  She found out she was exposed to some radiation at work.  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  Getting things worked out.  Has adjusted her diet.  Has lost weight.  Feels better.  No chest pain.  No sob.  No acid reflux.  No abdominal pain.  Has noticed her bowels have changed.  States the shape of her stool has changed.  No blood. Eating well.  No nausea or vomiting.    Past Medical History:  Diagnosis Date  . Breast cancer [facility])    s/p lumpectomy, segmental mastectomy, chemotherapy and XRT. s/p herceptin.   . Depression    related to cancer diagnosis  . Migraines   . Vitamin D deficiency    Past Surgical History:  Procedure Laterality Date  . BREAST LUMPECTOMY  03/15/10  . BREAST SURGERY     segmental mastectomy (port a cath placement)  . LASIK     Both Eyes  . PORT-A-CATH REMOVAL  05/11/10  . TUBAL LIGATION    . Uterine  2010   Ablation   Family History  Problem Relation Age of Onset  . Hypertension Mother   . Uterine cancer Maternal Grandmother   . Colon cancer Neg Hx    Social History   Socioeconomic History  . Marital status: Married    Spouse name: Not on file  . Number of children: 3  . Years of education: Not on file  . Highest education level: Not on file  Occupational History  . Not on file  Social Needs  . Financial resource strain: Not on file  . Food insecurity:    Worry: Not on file    Inability: Not on file  . Transportation needs:    Medical: Not on file    Non-medical: Not on file  Tobacco Use  . Smoking status: Never Smoker  . Smokeless tobacco: Never Used  Substance and Sexual Activity  . Alcohol use: Yes    Alcohol/week: 0.0 oz    Comment: rare  . Drug use:  No  . Sexual activity: Not on file  Lifestyle  . Physical activity:    Days per week: Not on file    Minutes per session: Not on file  . Stress: Not on file  Relationships  . Social connections:    Talks on phone: Not on file    Gets together: Not on file    Attends religious service: Not on file    Active member of club or organization: Not on file    Attends meetings of clubs or organizations: Not on file    Relationship status: Not on file  Other Topics Concern  . Not on file  Social History Narrative  . Not on file    Outpatient Encounter Medications as of 05/16/2017  Medication Sig  . cholecalciferol (VITAMIN D) 1000 UNITS tablet Take 1,000 Units by mouth daily.  . sertraline (ZOLOFT) 50 MG tablet Take 1 tablet (50 mg total) by mouth daily.  . [DISCONTINUED] mupirocin ointment (BACTROBAN) 2 % Apply to affected area bid  . [DISCONTINUED] sertraline (ZOLOFT) 50 MG tablet Take 1 tablet (50 mg total) by  mouth daily.  . [DISCONTINUED] sertraline (ZOLOFT) 50 MG tablet TAKE 1 TABLET DAILY  . [DISCONTINUED] sertraline (ZOLOFT) 50 MG tablet TAKE 1 TABLET DAILY  . [DISCONTINUED] TURMERIC PO Take by mouth.   No facility-administered encounter medications on file as of 05/16/2017.     Review of Systems  Constitutional: Negative for appetite change and unexpected weight change.  HENT: Negative for congestion and sinus pressure.   Respiratory: Negative for cough, chest tightness and shortness of breath.   Cardiovascular: Negative for chest pain, palpitations and leg swelling.  Gastrointestinal: Negative for abdominal pain, diarrhea, nausea and vomiting.       Change in stool as outlined.    Genitourinary: Negative for difficulty urinating and dysuria.  Musculoskeletal: Negative for joint swelling and myalgias.  Skin: Negative for color change and rash.  Neurological: Negative for dizziness, light-headedness and headaches.  Psychiatric/Behavioral: Negative for agitation and dysphoric  mood.       Objective:    Physical Exam  Constitutional: She appears well-developed and well-nourished. No distress.  HENT:  Nose: Nose normal.  Mouth/Throat: Oropharynx is clear and moist.  Neck: Neck supple. No thyromegaly present.  Cardiovascular: Normal rate and regular rhythm.  Pulmonary/Chest: Breath sounds normal. No respiratory distress. She has no wheezes.  Abdominal: Soft. Bowel sounds are normal. There is no tenderness.  Musculoskeletal: She exhibits no edema or tenderness.  Lymphadenopathy:    She has no cervical adenopathy.  Skin: No rash noted. No erythema.  Psychiatric: She has a normal mood and affect. Her behavior is normal.    BP 116/82 (BP Location: Right Arm, Patient Position: Sitting, Cuff Size: Normal)   Pulse 83   Temp 98.7 F (37.1 C) (Oral)   Resp 16   Wt 138 lb 3.2 oz (62.7 kg)   SpO2 97%   BMI 26.11 kg/m  Wt Readings from Last 3 Encounters:  05/16/17 138 lb 3.2 oz (62.7 kg)  03/07/16 152 lb 12.8 oz (69.3 kg)  09/04/15 154 lb 6 oz (70 kg)     Lab Results  Component Value Date   WBC 3.7 (L) 05/16/2017   HGB 13.6 05/16/2017   HCT 39.4 05/16/2017   PLT 241.0 05/16/2017   GLUCOSE 89 05/16/2017   CHOL 224 (H) 05/16/2017   TRIG 101.0 05/16/2017   HDL 46.90 05/16/2017   LDLDIRECT 160.6 02/25/2013   LDLCALC 157 (H) 05/16/2017   ALT 11 05/16/2017   AST 11 05/16/2017   NA 140 05/16/2017   K 4.0 05/16/2017   CL 107 05/16/2017   CREATININE 0.87 05/16/2017   BUN 14 05/16/2017   CO2 25 05/16/2017   TSH 1.84 05/16/2017       Assessment & Plan:   Problem List Items Addressed This Visit    Change in stool    Change in stool.  Due colonoscopy.  Schedule appt with GI      Relevant Orders   Ambulatory referral to Gastroenterology   Depression    On zoloft.  Doing well.  Follow.        Relevant Medications   sertraline (ZOLOFT) 50 MG tablet   History of breast cancer    Being followed by oncology - Dr Alinda Money.  Mammogram 04/02/17 - [facility]  II.        Hypercholesterolemia    Low cholesterol diet and exercise.  Follow lipid panel.        Relevant Orders   CBC with Differential/Platelet (Completed)   Hepatic function panel (Completed)   Lipid  panel (Completed)   TSH (Completed)   Basic metabolic panel (Completed)   Leukopenia    Follow cbc.       Vitamin D deficiency    Follow vitamin D level.        Relevant Orders   VITAMIN D 25 Hydroxy (Vit-D Deficiency, Fractures) (Completed)       Einar Pheasant, MD

## 2017-05-18 ENCOUNTER — Encounter: Payer: Self-pay | Admitting: Internal Medicine

## 2017-05-18 DIAGNOSIS — R195 Other fecal abnormalities: Secondary | ICD-10-CM | POA: Insufficient documentation

## 2017-05-18 NOTE — Assessment & Plan Note (Signed)
On zoloft.  Doing well.  Follow.  

## 2017-05-18 NOTE — Assessment & Plan Note (Signed)
Low cholesterol diet and exercise.  Follow lipid panel.   

## 2017-05-18 NOTE — Assessment & Plan Note (Signed)
Follow vitamin D level.  

## 2017-05-18 NOTE — Assessment & Plan Note (Signed)
Change in stool.  Due colonoscopy.  Schedule appt with GI

## 2017-05-18 NOTE — Assessment & Plan Note (Signed)
Follow cbc.  

## 2017-05-18 NOTE — Assessment & Plan Note (Signed)
Being followed by oncology - Dr Alinda Money.  Mammogram 04/02/17 - Birads II.

## 2017-05-19 ENCOUNTER — Other Ambulatory Visit: Payer: Self-pay | Admitting: Internal Medicine

## 2017-05-19 DIAGNOSIS — D72819 Decreased white blood cell count, unspecified: Secondary | ICD-10-CM

## 2017-05-19 NOTE — Progress Notes (Signed)
Order placed for f/u cbc.   

## 2017-06-10 ENCOUNTER — Encounter: Payer: Self-pay | Admitting: *Deleted

## 2017-06-27 DIAGNOSIS — Z1211 Encounter for screening for malignant neoplasm of colon: Secondary | ICD-10-CM | POA: Diagnosis not present

## 2017-06-27 DIAGNOSIS — R194 Change in bowel habit: Secondary | ICD-10-CM | POA: Diagnosis not present

## 2017-07-07 DIAGNOSIS — K640 First degree hemorrhoids: Secondary | ICD-10-CM | POA: Diagnosis not present

## 2017-07-07 DIAGNOSIS — Z1211 Encounter for screening for malignant neoplasm of colon: Secondary | ICD-10-CM | POA: Diagnosis not present

## 2017-07-07 DIAGNOSIS — K648 Other hemorrhoids: Secondary | ICD-10-CM | POA: Diagnosis not present

## 2017-07-07 LAB — HM COLONOSCOPY

## 2017-07-14 ENCOUNTER — Other Ambulatory Visit: Payer: Self-pay | Admitting: Internal Medicine

## 2017-11-12 ENCOUNTER — Ambulatory Visit (INDEPENDENT_AMBULATORY_CARE_PROVIDER_SITE_OTHER): Payer: BLUE CROSS/BLUE SHIELD | Admitting: Internal Medicine

## 2017-11-12 ENCOUNTER — Encounter: Payer: Self-pay | Admitting: Internal Medicine

## 2017-11-12 ENCOUNTER — Ambulatory Visit (INDEPENDENT_AMBULATORY_CARE_PROVIDER_SITE_OTHER): Payer: BLUE CROSS/BLUE SHIELD

## 2017-11-12 ENCOUNTER — Other Ambulatory Visit (HOSPITAL_COMMUNITY)
Admission: RE | Admit: 2017-11-12 | Discharge: 2017-11-12 | Disposition: A | Discharge status: Home / Self Care | Payer: BLUE CROSS/BLUE SHIELD | Source: Ambulatory Visit | Attending: Internal Medicine | Admitting: Internal Medicine

## 2017-11-12 VITALS — BP 110/80 | HR 80 | Temp 98.3°F | Resp 17 | Ht 61.0 in | Wt 140.5 lb

## 2017-11-12 DIAGNOSIS — R0602 Shortness of breath: Secondary | ICD-10-CM | POA: Diagnosis not present

## 2017-11-12 DIAGNOSIS — Z853 Personal history of malignant neoplasm of breast: Secondary | ICD-10-CM

## 2017-11-12 DIAGNOSIS — Z23 Encounter for immunization: Secondary | ICD-10-CM | POA: Diagnosis not present

## 2017-11-12 DIAGNOSIS — Z124 Encounter for screening for malignant neoplasm of cervix: Secondary | ICD-10-CM

## 2017-11-12 DIAGNOSIS — D72819 Decreased white blood cell count, unspecified: Secondary | ICD-10-CM

## 2017-11-12 DIAGNOSIS — Z0000 Encounter for general adult medical examination without abnormal findings: Secondary | ICD-10-CM | POA: Diagnosis not present

## 2017-11-12 DIAGNOSIS — E7800 Pure hypercholesterolemia, unspecified: Secondary | ICD-10-CM

## 2017-11-12 DIAGNOSIS — E559 Vitamin D deficiency, unspecified: Secondary | ICD-10-CM

## 2017-11-12 LAB — COMPREHENSIVE METABOLIC PANEL
ALK PHOS: 52 U/L (ref 39–117)
ALT: 17 U/L (ref 0–35)
AST: 17 U/L (ref 0–37)
Albumin: 4.4 g/dL (ref 3.5–5.2)
BUN: 13 mg/dL (ref 6–23)
CALCIUM: 9.5 mg/dL (ref 8.4–10.5)
CO2: 28 mEq/L (ref 19–32)
Chloride: 107 mEq/L (ref 96–112)
Creatinine, Ser: 0.85 mg/dL (ref 0.40–1.20)
GFR: 74.61 mL/min (ref >60.00)
Glucose, Bld: 97 mg/dL (ref 70–99)
Potassium: 4.1 mEq/L (ref 3.5–5.1)
SODIUM: 139 meq/L (ref 135–145)
TOTAL PROTEIN: 6.9 g/dL (ref 6.0–8.3)
Total Bilirubin: 0.5 mg/dL (ref 0.2–1.2)

## 2017-11-12 LAB — CBC WITH DIFFERENTIAL/PLATELET
BASOS PCT: 0.6 % (ref 0.0–3.0)
Basophils Absolute: 0 10*3/uL (ref 0.0–0.1)
EOS PCT: 1.3 % (ref 0.0–5.0)
Eosinophils Absolute: 0.1 10*3/uL (ref 0.0–0.7)
HCT: 41 % (ref 36.0–46.0)
Hemoglobin: 13.8 g/dL (ref 12.0–15.0)
LYMPHS ABS: 1.8 10*3/uL (ref 0.7–4.0)
Lymphocytes Relative: 41.9 % (ref 12.0–46.0)
MCHC: 33.5 g/dL (ref 30.0–36.0)
MCV: 84.8 fl (ref 78.0–100.0)
MONOS PCT: 7.3 % (ref 3.0–12.0)
Monocytes Absolute: 0.3 10*3/uL (ref 0.1–1.0)
NEUTROS PCT: 48.9 % (ref 43.0–77.0)
Neutro Abs: 2.1 10*3/uL (ref 1.4–7.7)
Platelets: 246 10*3/uL (ref 150.0–400.0)
RBC: 4.83 Mil/uL (ref 3.87–5.11)
RDW: 13.1 % (ref 11.5–15.5)
WBC: 4.3 10*3/uL (ref 4.0–10.5)

## 2017-11-12 LAB — LIPID PANEL
CHOL/HDL RATIO: 5
Cholesterol: 248 mg/dL — ABNORMAL HIGH (ref 0–200)
HDL: 54.7 mg/dL (ref >39.00)
LDL Cholesterol: 178 mg/dL — ABNORMAL HIGH (ref 0–99)
NonHDL: 192.94
Triglycerides: 77 mg/dL (ref 0.0–149.0)
VLDL: 15.4 mg/dL (ref 0.0–40.0)

## 2017-11-12 NOTE — Assessment & Plan Note (Addendum)
Physical today 11/12/17.  PAP 11/12/17.  Mammogram 04/02/17 - Birads II.  Had colonoscopy recently.  Recommended f/u in 10 years.

## 2017-11-12 NOTE — Progress Notes (Signed)
Patient ID: Brianna Gomez, female   DOB: Aug 28, 1965, 52 y.o.   MRN: [id number]   Subjective:    Patient ID: Brianna Gomez, female    DOB: October 23, 1965, 52 y.o.   MRN: [id number]  HPI  Patient here for her physical.  She reports noticing soreness in her chest.  Was questioning if related to scarring.  Notices some chest heaviness at times.  Not necessarily brought on by palpation.  No pain with deep breathing.  Does have some soreness with palpation.  Has noticed getting more winded with inclines.  No acid reflux.  No abdominal pain.  Bowels moving.  Some pain in her hands.  Worse at night and in the am.  Some decreased grip.  Due to f/u with oncology 03/2018.  Increased stress.  Daughter involved in [place].  Doing better now.  Overall she feels she is handling things relatively well.  Off zoloft.  Doing well off the medication.     Past Medical History:  Diagnosis Date  . Breast cancer [facility])    s/p lumpectomy, segmental mastectomy, chemotherapy and XRT. s/p herceptin.   . Depression    related to cancer diagnosis  . Migraines   . Vitamin D deficiency    Past Surgical History:  Procedure Laterality Date  . BREAST LUMPECTOMY  03/15/10  . BREAST SURGERY     segmental mastectomy (port a cath placement)  . LASIK     Both Eyes  . PORT-A-CATH REMOVAL  05/11/10  . TUBAL LIGATION    . Uterine  2010   Ablation   Family History  Problem Relation Age of Onset  . Hypertension Mother   . Uterine cancer Maternal Grandmother   . Colon cancer Neg Hx    Social History   Socioeconomic History  . Marital status: Married    Spouse name: Not on file  . Number of children: 3  . Years of education: Not on file  . Highest education level: Not on file  Occupational History  . Not on file  Social Needs  . Financial resource strain: Not on file  . Food insecurity:    Worry: Not on file    Inability: Not on file  . Transportation needs:    Medical: Not on file    Non-medical: Not on file  Tobacco  Use  . Smoking status: Never Smoker  . Smokeless tobacco: Never Used  Substance and Sexual Activity  . Alcohol use: Yes    Alcohol/week: 0.0 standard drinks    Comment: rare  . Drug use: No  . Sexual activity: Not on file  Lifestyle  . Physical activity:    Days per week: Not on file    Minutes per session: Not on file  . Stress: Not on file  Relationships  . Social connections:    Talks on phone: Not on file    Gets together: Not on file    Attends religious service: Not on file    Active member of club or organization: Not on file    Attends meetings of clubs or organizations: Not on file    Relationship status: Not on file  Other Topics Concern  . Not on file  Social History Narrative  . Not on file    Outpatient Encounter Medications as of 11/12/2017  Medication Sig  . cholecalciferol (VITAMIN D) 1000 UNITS tablet Take 1,000 Units by mouth daily.  . sertraline (ZOLOFT) 50 MG tablet TAKE 1 TABLET DAILY (Patient not taking: Reported  on 11/12/2017)   No facility-administered encounter medications on file as of 11/12/2017.     Review of Systems  Constitutional: Negative for appetite change and unexpected weight change.  HENT: Negative for congestion and sinus pressure.   Eyes: Negative for pain and visual disturbance.  Respiratory: Positive for chest tightness. Negative for cough and shortness of breath.   Cardiovascular: Positive for chest pain. Negative for palpitations and leg swelling.  Gastrointestinal: Negative for abdominal pain, diarrhea, nausea and vomiting.  Genitourinary: Negative for difficulty urinating and dysuria.  Musculoskeletal: Negative for joint swelling and myalgias.       Pain in her hands.    Skin: Negative for color change and rash.  Neurological: Negative for dizziness, light-headedness and headaches.  Hematological: Negative for adenopathy. Does not bruise/bleed easily.  Psychiatric/Behavioral: Negative for agitation and dysphoric mood.         Objective:    Physical Exam  BP 110/80   Pulse 80   Temp 98.3 F (36.8 C) (Oral)   Resp 17   Ht 5\' 1"  (1.549 m)   Wt 140 lb 8 oz (63.7 kg)   SpO2 97%   BMI 26.55 kg/m  Wt Readings from Last 3 Encounters:  11/12/17 140 lb 8 oz (63.7 kg)  05/16/17 138 lb 3.2 oz (62.7 kg)  03/07/16 152 lb 12.8 oz (69.3 kg)     Lab Results  Component Value Date   WBC 4.3 11/12/2017   HGB 13.8 11/12/2017   HCT 41.0 11/12/2017   PLT 246.0 11/12/2017   GLUCOSE 97 11/12/2017   CHOL 248 (H) 11/12/2017   TRIG 77.0 11/12/2017   HDL 54.70 11/12/2017   LDLDIRECT 160.6 02/25/2013   LDLCALC 178 (H) 11/12/2017   ALT 17 11/12/2017   AST 17 11/12/2017   NA 139 11/12/2017   K 4.1 11/12/2017   CL 107 11/12/2017   CREATININE 0.85 11/12/2017   BUN 13 11/12/2017   CO2 28 11/12/2017   TSH 1.84 05/16/2017       Assessment & Plan:   Problem List Items Addressed This Visit    Health care maintenance    Physical today 11/12/17.  PAP 11/12/17.  Mammogram 04/02/17 - Birads II.  Had colonoscopy recently.  Recommended f/u in 10 years.        History of breast cancer    Has been followed by oncology - Dr Alinda Money.  Mammogram 04/02/17 - Birads II.       Hypercholesterolemia    Low cholesterol diet and exercise.  Follow lipid panel.        Relevant Orders   Lipid panel (Completed)   Comprehensive metabolic panel (Completed)   Leukopenia    Follow cbc.        Relevant Orders   CBC with Differential/Platelet (Completed)   SOB (shortness of breath) on exertion    Has noticed some chest soreness and intermittent chest tightness.  Some sob with inclines.  EKG - SR with no acute ischemic changes.  Discussed with her today.  Will have cardiology evaluate to confirm no further w/up warranted.  Check cxr.        Relevant Orders   Ambulatory referral to Cardiology   Vitamin D deficiency    Follow vitamin D level.         Other Visit Diagnoses    Routine general medical examination at a health care  facility    -  Primary   Need for immunization against influenza  Relevant Orders   Flu Vaccine QUAD 36+ mos IM (Completed)   SOB (shortness of breath)       Relevant Orders   EKG 12-Lead (Completed)   DG Chest 2 View (Completed)   Pap smear for cervical cancer screening       Relevant Orders   Cytology - PAP (Completed)       Einar Pheasant, MD

## 2017-11-13 LAB — CYTOLOGY - PAP
DIAGNOSIS: NEGATIVE
HPV (WINDOPATH): NOT DETECTED

## 2017-11-14 ENCOUNTER — Encounter: Payer: Self-pay | Admitting: Internal Medicine

## 2017-11-15 ENCOUNTER — Encounter: Payer: Self-pay | Admitting: Internal Medicine

## 2017-11-15 DIAGNOSIS — R0602 Shortness of breath: Secondary | ICD-10-CM | POA: Insufficient documentation

## 2017-11-15 NOTE — Assessment & Plan Note (Signed)
Has noticed some chest soreness and intermittent chest tightness.  Some sob with inclines.  EKG - SR with no acute ischemic changes.  Discussed with her today.  Will have cardiology evaluate to confirm no further w/up warranted.  Check cxr.

## 2017-11-15 NOTE — Assessment & Plan Note (Signed)
Follow cbc.  

## 2017-11-15 NOTE — Assessment & Plan Note (Signed)
Low cholesterol diet and exercise.  Follow lipid panel.   

## 2017-11-15 NOTE — Assessment & Plan Note (Signed)
Follow vitamin D level.  

## 2017-11-15 NOTE — Assessment & Plan Note (Signed)
Has been followed by oncology - Dr Alinda Money.  Mammogram 04/02/17 - Birads II.

## 2018-02-17 ENCOUNTER — Encounter: Payer: Self-pay | Admitting: Family Medicine

## 2018-02-17 ENCOUNTER — Ambulatory Visit (INDEPENDENT_AMBULATORY_CARE_PROVIDER_SITE_OTHER): Payer: Managed Care, Other (non HMO) | Admitting: Family Medicine

## 2018-02-17 VITALS — BP 124/80 | HR 104 | Temp 99.6°F | Resp 18 | Ht 61.0 in | Wt 141.6 lb

## 2018-02-17 DIAGNOSIS — R062 Wheezing: Secondary | ICD-10-CM

## 2018-02-17 DIAGNOSIS — J019 Acute sinusitis, unspecified: Secondary | ICD-10-CM

## 2018-02-17 DIAGNOSIS — R05 Cough: Secondary | ICD-10-CM

## 2018-02-17 DIAGNOSIS — R059 Cough, unspecified: Secondary | ICD-10-CM

## 2018-02-17 MED ORDER — ALBUTEROL SULFATE HFA 108 (90 BASE) MCG/ACT IN AERS: 2.0000 | INHALATION_SPRAY | Freq: Four times a day (QID) | RESPIRATORY_TRACT | 0 refills | Status: DC | PRN

## 2018-02-17 MED ORDER — AMOXICILLIN-POT CLAVULANATE 875-125 MG PO TABS: 1.0000 | ORAL_TABLET | Freq: Two times a day (BID) | ORAL | 0 refills | Status: DC

## 2018-02-17 MED ORDER — BENZONATATE 100 MG PO CAPS: 100.0000 mg | ORAL_CAPSULE | Freq: Three times a day (TID) | ORAL | 0 refills | Status: DC | PRN

## 2018-02-17 MED ORDER — METHYLPREDNISOLONE 4 MG PO TBPK: ORAL_TABLET | ORAL | 0 refills | Status: DC

## 2018-02-17 NOTE — Progress Notes (Signed)
Subjective:    Patient ID: Brianna Gomez, female    DOB: 07/26/1965, 53 y.o.   MRN: [id number]  HPI   Patient presents to clinic complaining of sinus congestion, sinus pain, thick nasal drainage for the past 3 to 4 weeks.  Patient has been taking over-the-counter allergy medicines and over-the-counter Alka-Seltzer without much relief in symptoms.  Patient also developed some cough and wheezing, more so at night over the past 3 to 4 days.  Patient states she does not cough up phlegm.  Denies chest pain.  Denies fever or chills.  Patient Active Problem List   Diagnosis Date Noted  . SOB (shortness of breath) on exertion 11/15/2017  . Change in stool 05/18/2017  . Weight gain 09/04/2015  . Left upper arm pain 03/06/2015  . Health care maintenance 03/06/2015  . Fatigue 11/14/2013  . Leukopenia 04/11/2013  . Hypercholesterolemia 02/25/2013  . Environmental allergies 02/25/2012  . Depression 02/25/2012  . Migraine 02/25/2012  . Vitamin D deficiency 02/25/2012  . History of breast cancer 02/24/2012   Social History   Tobacco Use  . Smoking status: Never Smoker  . Smokeless tobacco: Never Used  Substance Use Topics  . Alcohol use: Yes    Alcohol/week: 0.0 standard drinks    Comment: rare   Review of Systems   Constitutional: Negative for chills, fatigue and fever.  HENT: +congestion, sinus pain and sore throat.   Eyes: Negative.   Respiratory: +cough and wheezing. Negative for shortness of breath.   Cardiovascular: Negative for chest pain, palpitations and leg swelling.  Gastrointestinal: Negative for abdominal pain, diarrhea, nausea and vomiting.  Genitourinary: Negative for dysuria, frequency and urgency.  Musculoskeletal: Negative for arthralgias and myalgias.  Skin: Negative for color change, pallor and rash.  Neurological: Negative for syncope, light-headedness and headaches.  Psychiatric/Behavioral: The patient is not nervous/anxious.       Objective:   Physical  Exam Vitals signs and nursing note reviewed.  Constitutional:      General: She is not in acute distress.    Appearance: Normal appearance. She is not toxic-appearing.  HENT:     Head: Normocephalic and atraumatic.     Nose:     Right Sinus: Maxillary sinus tenderness and frontal sinus tenderness present.     Left Sinus: Maxillary sinus tenderness and frontal sinus tenderness present.     Comments: Fullness bilateral TMs, +post nasal drip, +thick yellow nasal drainage.  Eyes:     General: No scleral icterus.    Extraocular Movements: Extraocular movements intact.     Conjunctiva/sclera: Conjunctivae normal.  Neck:     Musculoskeletal: Neck supple. No neck rigidity.  Cardiovascular:     Rate and Rhythm: Normal rate and regular rhythm.  Pulmonary:     Effort: Pulmonary effort is normal. No respiratory distress.     Breath sounds: Wheezing (faint expiratory wheeze) present. No rhonchi or rales.  Lymphadenopathy:     Cervical: No cervical adenopathy.  Skin:    General: Skin is warm and dry.     Coloration: Skin is not pale.  Neurological:     Mental Status: She is alert and oriented to person, place, and time.     Gait: Gait normal.  Psychiatric:        Mood and Affect: Mood normal.        Behavior: Behavior normal.    Vitals:   02/17/18 1414  BP: 124/80  Pulse: (!) 104  Resp: 18  Temp: 99.6 F (37.6  C)  SpO2: 97%      Assessment & Plan:   Acute sinusitis, cough, wheeze - patient will take Augmentin twice daily to treat sinusitis.  Encouraged to try a saline nasal spray and or saline nasal rinse to help reduce congestion.  Patient will take round of steroids and take albuterol inhaler as needed for wheezing. She will also use tessalon perles as needed for cough.   Patient will keep regularly scheduled follow-up with PCP as planned.  Advised to return to clinic sooner if any issues arise.

## 2018-02-27 ENCOUNTER — Ambulatory Visit: Payer: BLUE CROSS/BLUE SHIELD | Admitting: Internal Medicine

## 2018-05-04 ENCOUNTER — Encounter: Payer: Self-pay | Admitting: Internal Medicine
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
## 2018-05-05 NOTE — Telephone Encounter (Signed)
This is the patient that we discussed.  With [place] doing web visits, hard to assess, etc.

## 2018-06-08 ENCOUNTER — Ambulatory Visit: Payer: Managed Care, Other (non HMO) | Admitting: Internal Medicine

## 2018-08-12 ENCOUNTER — Encounter: Payer: Self-pay | Admitting: Internal Medicine

## 2018-08-13 NOTE — Telephone Encounter (Signed)
Printed and sent to [employer]

## 2018-12-25 ENCOUNTER — Other Ambulatory Visit: Payer: Self-pay | Admitting: Internal Medicine

## 2019-02-16 DIAGNOSIS — Z03818 Encounter for observation for suspected exposure to other biological agents ruled out: Secondary | ICD-10-CM | POA: Diagnosis not present

## 2019-02-16 DIAGNOSIS — R05 Cough: Secondary | ICD-10-CM | POA: Diagnosis not present

## 2019-02-16 DIAGNOSIS — Z20828 Contact with and (suspected) exposure to other viral communicable diseases: Secondary | ICD-10-CM | POA: Diagnosis not present

## 2019-03-04 DIAGNOSIS — F3289 Other specified depressive episodes: Secondary | ICD-10-CM | POA: Diagnosis not present

## 2019-03-08 DIAGNOSIS — F3289 Other specified depressive episodes: Secondary | ICD-10-CM | POA: Diagnosis not present

## 2019-03-15 DIAGNOSIS — F3289 Other specified depressive episodes: Secondary | ICD-10-CM | POA: Diagnosis not present

## 2019-03-19 DIAGNOSIS — N6459 Other signs and symptoms in breast: Secondary | ICD-10-CM | POA: Diagnosis not present

## 2019-03-19 DIAGNOSIS — Z9221 Personal history of antineoplastic chemotherapy: Secondary | ICD-10-CM | POA: Diagnosis not present

## 2019-03-19 DIAGNOSIS — Z853 Personal history of malignant neoplasm of breast: Secondary | ICD-10-CM | POA: Diagnosis not present

## 2019-03-19 DIAGNOSIS — Z08 Encounter for follow-up examination after completed treatment for malignant neoplasm: Secondary | ICD-10-CM | POA: Diagnosis not present

## 2019-03-19 DIAGNOSIS — Z0000 Encounter for general adult medical examination without abnormal findings: Secondary | ICD-10-CM | POA: Diagnosis not present

## 2019-03-19 LAB — HM MAMMOGRAPHY

## 2019-03-22 DIAGNOSIS — F3289 Other specified depressive episodes: Secondary | ICD-10-CM | POA: Diagnosis not present

## 2019-04-05 DIAGNOSIS — F3289 Other specified depressive episodes: Secondary | ICD-10-CM | POA: Diagnosis not present

## 2019-04-12 DIAGNOSIS — F3289 Other specified depressive episodes: Secondary | ICD-10-CM | POA: Diagnosis not present

## 2019-04-19 DIAGNOSIS — F3289 Other specified depressive episodes: Secondary | ICD-10-CM | POA: Diagnosis not present

## 2019-04-22 ENCOUNTER — Ambulatory Visit (INDEPENDENT_AMBULATORY_CARE_PROVIDER_SITE_OTHER): Payer: BC Managed Care – PPO | Admitting: Internal Medicine

## 2019-04-22 ENCOUNTER — Other Ambulatory Visit: Payer: Self-pay

## 2019-04-22 ENCOUNTER — Encounter: Payer: Self-pay | Admitting: Internal Medicine

## 2019-04-22 VITALS — BP 124/70 | HR 72 | Temp 96.8°F | Resp 16 | Ht 61.0 in | Wt 150.0 lb

## 2019-04-22 DIAGNOSIS — E7800 Pure hypercholesterolemia, unspecified: Secondary | ICD-10-CM | POA: Diagnosis not present

## 2019-04-22 DIAGNOSIS — Z0000 Encounter for general adult medical examination without abnormal findings: Secondary | ICD-10-CM | POA: Diagnosis not present

## 2019-04-22 DIAGNOSIS — E559 Vitamin D deficiency, unspecified: Secondary | ICD-10-CM

## 2019-04-22 DIAGNOSIS — D72819 Decreased white blood cell count, unspecified: Secondary | ICD-10-CM

## 2019-04-22 DIAGNOSIS — Z853 Personal history of malignant neoplasm of breast: Secondary | ICD-10-CM | POA: Diagnosis not present

## 2019-04-22 LAB — COMPREHENSIVE METABOLIC PANEL
ALT: 30 U/L (ref 0–35)
AST: 21 U/L (ref 0–37)
Albumin: 4.2 g/dL (ref 3.5–5.2)
Alkaline Phosphatase: 70 U/L (ref 39–117)
BUN: 11 mg/dL (ref 6–23)
CO2: 26 mEq/L (ref 19–32)
Calcium: 9.2 mg/dL (ref 8.4–10.5)
Chloride: 109 mEq/L (ref 96–112)
Creatinine, Ser: 0.81 mg/dL (ref 0.40–1.20)
GFR: 73.8 mL/min (ref >60.00)
Glucose, Bld: 85 mg/dL (ref 70–99)
Potassium: 3.9 mEq/L (ref 3.5–5.1)
Sodium: 141 mEq/L (ref 135–145)
Total Bilirubin: 0.5 mg/dL (ref 0.2–1.2)
Total Protein: 6.7 g/dL (ref 6.0–8.3)

## 2019-04-22 LAB — CBC WITH DIFFERENTIAL/PLATELET
Basophils Absolute: 0 10*3/uL (ref 0.0–0.1)
Basophils Relative: 0.4 % (ref 0.0–3.0)
Eosinophils Absolute: 0.1 10*3/uL (ref 0.0–0.7)
Eosinophils Relative: 1.3 % (ref 0.0–5.0)
HCT: 40.2 % (ref 36.0–46.0)
Hemoglobin: 13.5 g/dL (ref 12.0–15.0)
Lymphocytes Relative: 39.7 % (ref 12.0–46.0)
Lymphs Abs: 1.7 10*3/uL (ref 0.7–4.0)
MCHC: 33.7 g/dL (ref 30.0–36.0)
MCV: 83.9 fl (ref 78.0–100.0)
Monocytes Absolute: 0.3 10*3/uL (ref 0.1–1.0)
Monocytes Relative: 6.2 % (ref 3.0–12.0)
Neutro Abs: 2.3 10*3/uL (ref 1.4–7.7)
Neutrophils Relative %: 52.4 % (ref 43.0–77.0)
Platelets: 237 10*3/uL (ref 150.0–400.0)
RBC: 4.79 Mil/uL (ref 3.87–5.11)
RDW: 13.4 % (ref 11.5–15.5)
WBC: 4.3 10*3/uL (ref 4.0–10.5)

## 2019-04-22 LAB — MAGNESIUM: Magnesium: 1.9 mg/dL (ref 1.5–2.5)

## 2019-04-22 LAB — LIPID PANEL
Cholesterol: 242 mg/dL — ABNORMAL HIGH (ref 0–200)
HDL: 47.2 mg/dL (ref >39.00)
LDL Cholesterol: 171 mg/dL — ABNORMAL HIGH (ref 0–99)
NonHDL: 194.34
Total CHOL/HDL Ratio: 5
Triglycerides: 115 mg/dL (ref 0.0–149.0)
VLDL: 23 mg/dL (ref 0.0–40.0)

## 2019-04-22 LAB — VITAMIN D 25 HYDROXY (VIT D DEFICIENCY, FRACTURES): VITD: 29.17 ng/mL — ABNORMAL LOW (ref 30.00–100.00)

## 2019-04-22 LAB — HEMOGLOBIN A1C: Hgb A1c MFr Bld: 5.4 % (ref 4.6–6.5)

## 2019-04-22 LAB — TSH: TSH: 2.27 u[IU]/mL (ref 0.35–4.50)

## 2019-04-22 NOTE — Progress Notes (Signed)
Patient ID: Brianna Gomez, female   DOB: 1965-04-11, 54 y.o.   MRN: [id number]   Subjective:    Patient ID: Brianna Gomez, female    DOB: 1965/10/25, 54 y.o.   MRN: [id number]  HPI  Patient here for her physical exam.  She reports she is doing relatively well.  Increased stress.  Job stress.  Discussed.  Overall handling things relatively well.  Has good support.  Off zoloft.  Does not feel needs.  Tries to stay active.  No chest pain or sob reported.  No abdominal pain or bowel change reported.  Previously diagnosed with covid.  Had sinus congestion and headache with some taste change.  Doing better now.  Recently saw oncology.  Stable.  Mammogram ok.     Past Medical History:  Diagnosis Date  . Breast cancer [facility])    s/p lumpectomy, segmental mastectomy, chemotherapy and XRT. s/p herceptin.   . Depression    related to cancer diagnosis  . Migraines   . Vitamin D deficiency    Past Surgical History:  Procedure Laterality Date  . BREAST LUMPECTOMY  03/15/10  . BREAST SURGERY     segmental mastectomy (port a cath placement)  . LASIK     Both Eyes  . PORT-A-CATH REMOVAL  05/11/10  . TUBAL LIGATION    . Uterine  2010   Ablation   Family History  Problem Relation Age of Onset  . Hypertension Mother   . Uterine cancer Maternal Grandmother   . Colon cancer Neg Hx    Social History   Socioeconomic History  . Marital status: Married    Spouse name: Not on file  . Number of children: 3  . Years of education: Not on file  . Highest education level: Not on file  Occupational History  . Not on file  Tobacco Use  . Smoking status: Never Smoker  . Smokeless tobacco: Never Used  Substance and Sexual Activity  . Alcohol use: Yes    Alcohol/week: 0.0 standard drinks    Comment: rare  . Drug use: No  . Sexual activity: Not on file  Other Topics Concern  . Not on file  Social History Narrative  . Not on file   Social Determinants of Health   Financial Resource Strain:   .  Difficulty of Paying Living Expenses:   Food Insecurity:   . Worried About Charity fundraiser in the Last Year:   . Arboriculturist in the Last Year:   Transportation Needs:   . Film/video editor (Medical):   Marland Kitchen Lack of Transportation (Non-Medical):   Physical Activity:   . Days of Exercise per Week:   . Minutes of Exercise per Session:   Stress:   . Feeling of Stress :   Social Connections:   . Frequency of Communication with Friends and Family:   . Frequency of Social Gatherings with Friends and Family:   . Attends Religious Services:   . Active Member of Clubs or Organizations:   . Attends Archivist Meetings:   Marland Kitchen Marital Status:     Outpatient Encounter Medications as of 04/22/2019  Medication Sig  . cholecalciferol (VITAMIN D) 1000 UNITS tablet Take 1,000 Units by mouth daily.  . [DISCONTINUED] albuterol (PROVENTIL HFA;VENTOLIN HFA) 108 (90 Base) MCG/ACT inhaler Inhale 2 puffs into the lungs every 6 (six) hours as needed for wheezing or shortness of breath.  . [DISCONTINUED] amoxicillin-clavulanate (AUGMENTIN) 875-125 MG tablet Take 1 tablet  by mouth 2 (two) times daily.  . [DISCONTINUED] benzonatate (TESSALON) 100 MG capsule Take 1 capsule (100 mg total) by mouth 3 (three) times daily as needed.  . [DISCONTINUED] methylPREDNISolone (MEDROL) 4 MG TBPK tablet Take according to pack instructions  . [DISCONTINUED] sertraline (ZOLOFT) 50 MG tablet TAKE 1 TABLET DAILY   No facility-administered encounter medications on file as of 04/22/2019.    Review of Systems  Constitutional: Negative for appetite change and unexpected weight change.  HENT: Negative for congestion and sinus pressure.   Eyes: Negative for pain and visual disturbance.  Respiratory: Negative for cough, chest tightness and shortness of breath.   Cardiovascular: Negative for chest pain, palpitations and leg swelling.  Gastrointestinal: Negative for abdominal pain, diarrhea, nausea and vomiting.    Genitourinary: Negative for difficulty urinating and dysuria.  Musculoskeletal: Negative for joint swelling and myalgias.  Skin: Negative for color change and rash.  Neurological: Negative for dizziness, light-headedness and headaches.  Hematological: Negative for adenopathy. Does not bruise/bleed easily.  Psychiatric/Behavioral: Negative for agitation and dysphoric mood.       Objective:    Physical Exam Constitutional:      General: She is not in acute distress.    Appearance: Normal appearance.  HENT:     Head: Normocephalic and atraumatic.     Right Ear: External ear normal.     Left Ear: External ear normal.  Eyes:     General: No scleral icterus.       Right eye: No discharge.        Left eye: No discharge.     Conjunctiva/sclera: Conjunctivae normal.  Neck:     Thyroid: No thyromegaly.  Cardiovascular:     Rate and Rhythm: Normal rate and regular rhythm.  Pulmonary:     Effort: No respiratory distress.     Breath sounds: Normal breath sounds. No wheezing.  Abdominal:     General: Bowel sounds are normal.     Palpations: Abdomen is soft.     Tenderness: There is no abdominal tenderness.  Musculoskeletal:        General: No swelling or tenderness.     Cervical back: Neck supple. No tenderness.  Lymphadenopathy:     Cervical: No cervical adenopathy.  Skin:    Findings: No erythema or rash.  Neurological:     Mental Status: She is alert and oriented to person, place, and time.  Psychiatric:        Mood and Affect: Mood normal.        Behavior: Behavior normal.     BP 124/70   Pulse 72   Temp (!) 96.8 F (36 C)   Resp 16   Ht 5\' 1"  (1.549 m)   Wt 150 lb (68 kg)   SpO2 98%   BMI 28.34 kg/m  Wt Readings from Last 3 Encounters:  04/22/19 150 lb (68 kg)  02/17/18 141 lb 9.6 oz (64.2 kg)  11/12/17 140 lb 8 oz (63.7 kg)     Lab Results  Component Value Date   WBC 4.3 04/22/2019   HGB 13.5 04/22/2019   HCT 40.2 04/22/2019   PLT 237.0 04/22/2019    GLUCOSE 85 04/22/2019   CHOL 242 (H) 04/22/2019   TRIG 115.0 04/22/2019   HDL 47.20 04/22/2019   LDLDIRECT 160.6 02/25/2013   LDLCALC 171 (H) 04/22/2019   ALT 30 04/22/2019   AST 21 04/22/2019   NA 141 04/22/2019   K 3.9 04/22/2019   CL 109 04/22/2019  CREATININE 0.81 04/22/2019   BUN 11 04/22/2019   CO2 26 04/22/2019   TSH 2.27 04/22/2019   HGBA1C 5.4 04/22/2019    DG Chest 2 View  Result Date: 11/12/2017 CLINICAL DATA:  Shortness of breath. EXAM: CHEST - 2 VIEW COMPARISON:  None. FINDINGS: The heart size and mediastinal contours are within normal limits. Both lungs are clear. No pneumothorax or pleural effusion is noted. The visualized skeletal structures are unremarkable. IMPRESSION: No active cardiopulmonary disease. Electronically Signed   By: Marijo Conception, M.D.   On: 11/12/2017 14:52       Assessment & Plan:   Problem List Items Addressed This Visit    Health care maintenance    Physical today 04/22/19.  PAP 11/12/17 negative with negative HPV. Mammogram 03/19/19 - Birads II.  Need colonoscopy report.       History of breast cancer    Has been followed by oncology.  Mammogram 03/19/19 - Birads II.       Relevant Orders   Hemoglobin A1c (Completed)   Magnesium (Completed)   Hypercholesterolemia - Primary    Low cholesterol diet and exercise.  Follow lipid panel.        Relevant Orders   CBC with Differential/Platelet (Completed)   Comprehensive metabolic panel (Completed)   TSH (Completed)   Lipid panel (Completed)   Leukopenia    White blood cell count 04/22/19 wnl.       Vitamin D deficiency    Check vitamin D level.       Relevant Orders   VITAMIN D 25 Hydroxy (Vit-D Deficiency, Fractures) (Completed)       Einar Pheasant, MD

## 2019-04-24 ENCOUNTER — Encounter: Payer: Self-pay | Admitting: Internal Medicine

## 2019-04-26 ENCOUNTER — Encounter: Payer: Self-pay | Admitting: Internal Medicine

## 2019-04-26 ENCOUNTER — Telehealth: Payer: Self-pay | Admitting: Internal Medicine

## 2019-04-26 DIAGNOSIS — F3289 Other specified depressive episodes: Secondary | ICD-10-CM | POA: Diagnosis not present

## 2019-04-26 NOTE — Telephone Encounter (Signed)
Reports had colonoscopy.  Need results. Appear may have had through [place].  Will need to clarify.  Thanks

## 2019-04-26 NOTE — Assessment & Plan Note (Signed)
White blood cell count 04/22/19 wnl.

## 2019-04-26 NOTE — Assessment & Plan Note (Signed)
Has been followed by oncology.  Mammogram 03/19/19 - Birads II.

## 2019-04-26 NOTE — Telephone Encounter (Signed)
LMTCB

## 2019-04-26 NOTE — Assessment & Plan Note (Signed)
Physical today 04/22/19.  PAP 11/12/17 negative with negative HPV. Mammogram 03/19/19 - Birads II.  Need colonoscopy report.

## 2019-04-26 NOTE — Assessment & Plan Note (Signed)
Low cholesterol diet and exercise.  Follow lipid panel.   

## 2019-04-26 NOTE — Assessment & Plan Note (Signed)
Check vitamin D level 

## 2019-04-27 NOTE — Telephone Encounter (Signed)
Colonoscopy  requested.

## 2019-05-03 DIAGNOSIS — F3289 Other specified depressive episodes: Secondary | ICD-10-CM | POA: Diagnosis not present

## 2019-05-10 DIAGNOSIS — F3289 Other specified depressive episodes: Secondary | ICD-10-CM | POA: Diagnosis not present

## 2019-05-24 DIAGNOSIS — F3289 Other specified depressive episodes: Secondary | ICD-10-CM | POA: Diagnosis not present

## 2019-05-31 DIAGNOSIS — F3289 Other specified depressive episodes: Secondary | ICD-10-CM | POA: Diagnosis not present

## 2019-06-14 DIAGNOSIS — F3289 Other specified depressive episodes: Secondary | ICD-10-CM | POA: Diagnosis not present

## 2019-07-08 DIAGNOSIS — F3289 Other specified depressive episodes: Secondary | ICD-10-CM | POA: Diagnosis not present

## 2019-07-12 ENCOUNTER — Encounter: Payer: Self-pay | Admitting: Internal Medicine

## 2019-08-05 DIAGNOSIS — F3289 Other specified depressive episodes: Secondary | ICD-10-CM | POA: Diagnosis not present

## 2019-10-12 IMAGING — DX DG CHEST 2V
2 series · 2 of 2 positions shown · non-contrast
Comparison: None.

CLINICAL DATA: Shortness of breath.

EXAM:
CHEST - 2 VIEW

[chest pa]
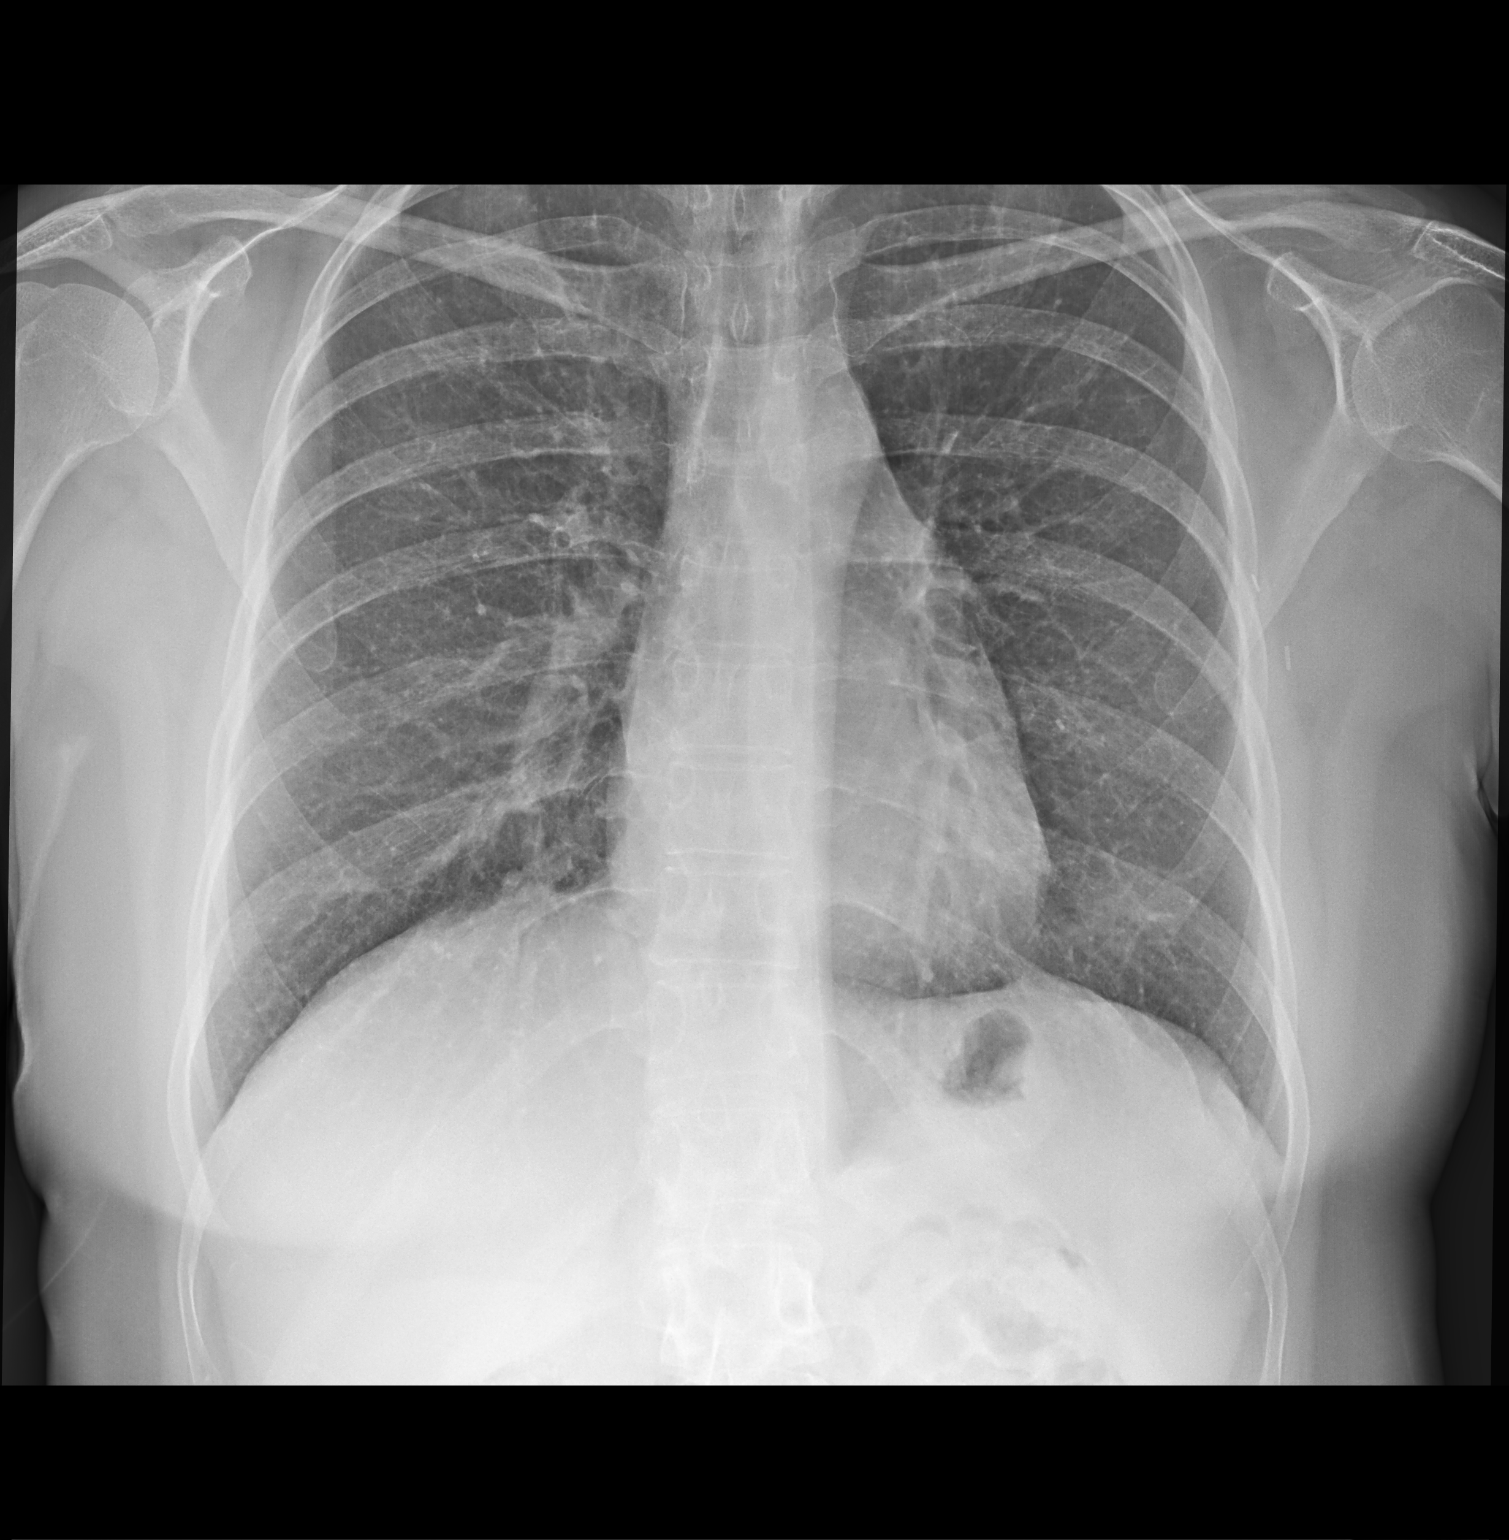

[chest lat]
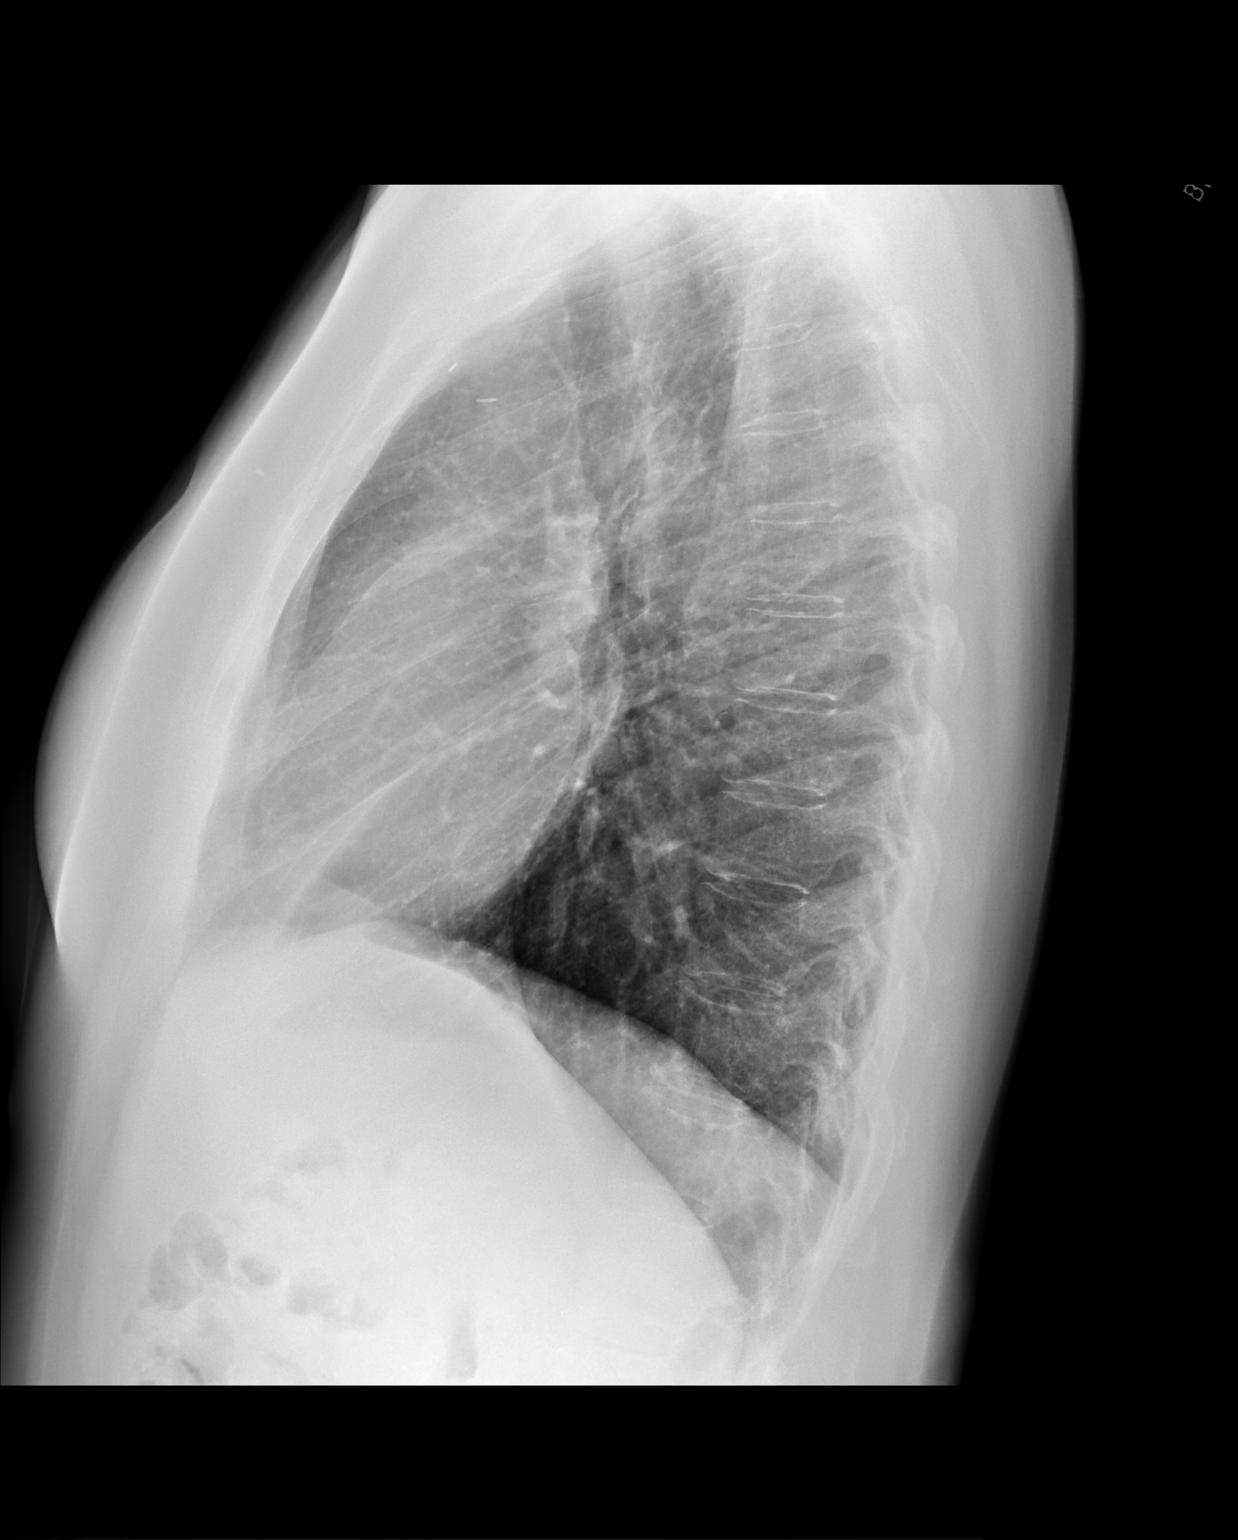

[2 of 2 positions shown; findings below may reference images not displayed]

FINDINGS: The heart size and mediastinal contours are within normal limits.
Both lungs are clear. No pneumothorax or pleural effusion is noted.
The visualized skeletal structures are unremarkable.
IMPRESSION: No active cardiopulmonary disease.

## 2020-03-27 ENCOUNTER — Encounter: Payer: Self-pay | Admitting: Internal Medicine

## 2020-03-29 NOTE — Telephone Encounter (Signed)
Ailene Ravel called her insurance carrier and they informed her that she was covered under Dr. Nicki Reaper.

## 2020-04-05 ENCOUNTER — Encounter: Payer: Self-pay | Admitting: Internal Medicine

## 2021-02-26 DIAGNOSIS — E038 Other specified hypothyroidism: Secondary | ICD-10-CM | POA: Diagnosis not present

## 2021-02-26 DIAGNOSIS — E7800 Pure hypercholesterolemia, unspecified: Secondary | ICD-10-CM | POA: Diagnosis not present

## 2021-02-26 DIAGNOSIS — R5383 Other fatigue: Secondary | ICD-10-CM | POA: Diagnosis not present

## 2021-02-26 DIAGNOSIS — C50919 Malignant neoplasm of unspecified site of unspecified female breast: Secondary | ICD-10-CM | POA: Diagnosis not present

## 2021-03-07 DIAGNOSIS — E7800 Pure hypercholesterolemia, unspecified: Secondary | ICD-10-CM | POA: Diagnosis not present

## 2021-03-07 DIAGNOSIS — R5383 Other fatigue: Secondary | ICD-10-CM | POA: Diagnosis not present

## 2021-03-07 DIAGNOSIS — E039 Hypothyroidism, unspecified: Secondary | ICD-10-CM | POA: Diagnosis not present

## 2021-03-30 DIAGNOSIS — R21 Rash and other nonspecific skin eruption: Secondary | ICD-10-CM | POA: Diagnosis not present

## 2021-03-30 DIAGNOSIS — C50912 Malignant neoplasm of unspecified site of left female breast: Secondary | ICD-10-CM | POA: Diagnosis not present

## 2021-03-30 DIAGNOSIS — Z9221 Personal history of antineoplastic chemotherapy: Secondary | ICD-10-CM | POA: Diagnosis not present

## 2021-03-30 DIAGNOSIS — Z923 Personal history of irradiation: Secondary | ICD-10-CM | POA: Diagnosis not present

## 2021-03-30 DIAGNOSIS — Z171 Estrogen receptor negative status [ER-]: Secondary | ICD-10-CM | POA: Diagnosis not present

## 2021-03-30 DIAGNOSIS — Z853 Personal history of malignant neoplasm of breast: Secondary | ICD-10-CM | POA: Diagnosis not present

## 2021-03-30 DIAGNOSIS — N6489 Other specified disorders of breast: Secondary | ICD-10-CM | POA: Diagnosis not present

## 2021-04-18 DIAGNOSIS — Z9221 Personal history of antineoplastic chemotherapy: Secondary | ICD-10-CM | POA: Diagnosis not present

## 2021-04-18 DIAGNOSIS — R7989 Other specified abnormal findings of blood chemistry: Secondary | ICD-10-CM | POA: Diagnosis not present

## 2021-04-18 DIAGNOSIS — E039 Hypothyroidism, unspecified: Secondary | ICD-10-CM | POA: Diagnosis not present

## 2021-04-18 DIAGNOSIS — R946 Abnormal results of thyroid function studies: Secondary | ICD-10-CM | POA: Diagnosis not present

## 2021-06-27 DIAGNOSIS — R7989 Other specified abnormal findings of blood chemistry: Secondary | ICD-10-CM | POA: Diagnosis not present

## 2021-06-27 DIAGNOSIS — E039 Hypothyroidism, unspecified: Secondary | ICD-10-CM | POA: Diagnosis not present

## 2022-11-22 ENCOUNTER — Other Ambulatory Visit: Payer: Self-pay | Admitting: *Deleted

## 2022-11-22 ENCOUNTER — Inpatient Hospital Stay
Admission: RE | Admit: 2022-11-22 | Discharge: 2022-11-22 | Disposition: A | Discharge status: Home / Self Care | Payer: Self-pay | Source: Ambulatory Visit | Attending: Internal Medicine | Admitting: Internal Medicine

## 2022-11-22 ENCOUNTER — Other Ambulatory Visit: Payer: Self-pay | Admitting: Family Medicine

## 2022-11-22 DIAGNOSIS — Z1231 Encounter for screening mammogram for malignant neoplasm of breast: Secondary | ICD-10-CM

## 2023-04-07 ENCOUNTER — Ambulatory Visit
Admission: RE | Admit: 2023-04-07 | Discharge: 2023-04-07 | Disposition: A | Discharge status: Home / Self Care | Payer: BC Managed Care – PPO | Source: Ambulatory Visit | Attending: Family Medicine | Admitting: Family Medicine

## 2023-04-07 DIAGNOSIS — Z1231 Encounter for screening mammogram for malignant neoplasm of breast: Secondary | ICD-10-CM | POA: Insufficient documentation
